# Patient Record
Sex: Male | Born: 1995
Health system: Southern US, Community
[De-identification: ages and names within clinical notes are randomized; demographics above are authoritative.]

## PROBLEM LIST (undated history)

## (undated) DIAGNOSIS — D573 Sickle-cell trait: Secondary | ICD-10-CM

---

## 2015-08-26 HISTORY — PX: KNEE SURGERY: SHX244

## 2015-12-15 ENCOUNTER — Ambulatory Visit
Admission: EM | Admit: 2015-12-15 | Discharge: 2015-12-15 | Disposition: A | Discharge status: Home / Self Care | Payer: BLUE CROSS/BLUE SHIELD | Attending: Family Medicine | Admitting: Family Medicine

## 2015-12-15 DIAGNOSIS — R319 Hematuria, unspecified: Secondary | ICD-10-CM

## 2015-12-15 DIAGNOSIS — R8299 Other abnormal findings in urine: Secondary | ICD-10-CM

## 2015-12-15 DIAGNOSIS — R82998 Other abnormal findings in urine: Secondary | ICD-10-CM

## 2015-12-15 HISTORY — DX: Sickle-cell trait: D57.3

## 2015-12-15 LAB — URINALYSIS COMPLETE WITH MICROSCOPIC (ARMC ONLY)
Bacteria, UA: NONE SEEN
Glucose, UA: NEGATIVE mg/dL
Leukocytes, UA: NEGATIVE
Nitrite: NEGATIVE
Protein, ur: 30 mg/dL — AB
Specific Gravity, Urine: >1.03 — ABNORMAL HIGH (ref 1.005–1.030)
Squamous Epithelial / LPF: NONE SEEN
WBC, UA: NONE SEEN WBC/hpf (ref 0–5)
pH: 6 (ref 5.0–8.0)

## 2015-12-15 LAB — CHLAMYDIA/NGC RT PCR (ARMC ONLY)
Chlamydia Tr: NOT DETECTED
N gonorrhoeae: NOT DETECTED

## 2015-12-15 LAB — BASIC METABOLIC PANEL
Anion gap: 9 (ref 5–15)
BUN: 11 mg/dL (ref 6–20)
CO2: 26 mmol/L (ref 22–32)
Calcium: 9.9 mg/dL (ref 8.9–10.3)
Chloride: 103 mmol/L (ref 101–111)
Creatinine, Ser: 1.16 mg/dL (ref 0.61–1.24)
GFR calc Af Amer: >60 mL/min (ref >60)
GFR calc non Af Amer: >60 mL/min (ref >60)
Glucose, Bld: 103 mg/dL — ABNORMAL HIGH (ref 65–99)
Potassium: 4.1 mmol/L (ref 3.5–5.1)
Sodium: 138 mmol/L (ref 135–145)

## 2015-12-15 NOTE — ED Notes (Signed)
Pt noticed blood in his urine once today about one hour ago. Pt denies abdominal pain, back pain, dysuria. Pt is also c/o frequency. No H/O kidney stones.

## 2015-12-15 NOTE — Discharge Instructions (Signed)

## 2015-12-15 NOTE — ED Provider Notes (Signed)
CSN: [id number]     Arrival date & time 12/15/15  1658 History   First MD Initiated Contact with Patient 12/15/15 1747     Chief Complaint  Patient presents with  . Hematuria   (Consider location/radiation/quality/duration/timing/severity/associated sxs/prior Treatment) HPI Comments: 20 yo male with a c/o blood in the urine since about one hour ago. States developed suddenly and without any pain. Denies any trauma, injury, fevers, chills, dysuria, nausea, vomiting. Has noticed some increased frequency.   The history is provided by the patient.    Past Medical History  Diagnosis Date  . Sickle cell trait [facility])    Past Surgical History  Procedure Laterality Date  . Knee surgery Left 08/2015   No family history on file. Social History  Substance Use Topics  . Smoking status: Never Smoker   . Smokeless tobacco: None  . Alcohol Use: No    Review of Systems  Allergies  Sulfa antibiotics  Home Medications   Prior to Admission medications   Not on File   Meds Ordered and Administered this Visit  Medications - No data to display  BP 116/57 mmHg  Pulse 60  Temp(Src) 98.3 F (36.8 C) (Oral)  Resp 16  Ht  (1.727 m)  Wt 177 lb (80.287 kg)  BMI 26.92 kg/m2  SpO2 100% No data found.   Physical Exam  Constitutional: He is oriented to person, place, and time. He appears well-developed and well-nourished. No distress.  HENT:  Head: Normocephalic and atraumatic.  Cardiovascular: Normal rate, regular rhythm, normal heart sounds and intact distal pulses.   No murmur heard. Pulmonary/Chest: Effort normal and breath sounds normal. No respiratory distress. He has no wheezes. He has no rales.  Abdominal: Soft. Bowel sounds are normal. He exhibits no distension and no mass. There is no tenderness. There is no rebound and no guarding. Hernia confirmed negative in the right inguinal area and confirmed negative in the left inguinal area.  Genitourinary: Testes normal and penis  normal. No penile tenderness.  Lymphadenopathy:       Right: No inguinal adenopathy present.       Left: No inguinal adenopathy present.  Neurological: He is alert and oriented to person, place, and time.  Skin: No rash noted. He is not diaphoretic.  Nursing note and vitals reviewed.   ED Course  Procedures (including critical care time)  Labs Review Labs Reviewed  URINALYSIS COMPLETEWITH MICROSCOPIC ([facility] ONLY) - Abnormal; Notable for the following:    Color, Urine AMBER (*)    APPearance CLOUDY (*)    Bilirubin Urine 1+ (*)    Ketones, ur TRACE (*)    Specific Gravity, Urine >1.030 (*)    Hgb urine dipstick 3+ (*)    Protein, ur 30 (*)    All other components within normal limits  BASIC METABOLIC PANEL - Abnormal; Notable for the following:    Glucose, Bld 103 (*)    All other components within normal limits  CHLAMYDIA/NGC RT PCR [facility] ONLY)    Imaging Review No results found.   Visual Acuity Review  Right Eye Distance:   Left Eye Distance:   Bilateral Distance:    Right Eye Near:   Left Eye Near:    Bilateral Near:         MDM   1. Hematuria   2. Crystalluria    1. Lab results, diagnosis and possible etiologies reviewed with patient and parent 2. Recommend supportive treatment with increased water intake, otc tylenol prn;  strain urine 3. Discussed with patient and mother, that patient should follow up with PCP next week for re-evaluation and recheck urine; also recommend follow up with a urologist and may need further imaging with CT scan and/or IVP due to sickle cell trait and possible associations with renal diseases  Payton Mccallum, MD 12/15/15 1944

## 2016-08-19 ENCOUNTER — Encounter (HOSPITAL_COMMUNITY): Payer: Self-pay | Admitting: Emergency Medicine

## 2016-08-19 ENCOUNTER — Emergency Department (HOSPITAL_COMMUNITY): Payer: BLUE CROSS/BLUE SHIELD

## 2016-08-19 ENCOUNTER — Emergency Department (HOSPITAL_COMMUNITY)
Admission: EM | Admit: 2016-08-19 | Discharge: 2016-08-20 | Disposition: A | Discharge status: Home / Self Care | Payer: BLUE CROSS/BLUE SHIELD | Attending: Emergency Medicine | Admitting: Emergency Medicine

## 2016-08-19 DIAGNOSIS — F1290 Cannabis use, unspecified, uncomplicated: Secondary | ICD-10-CM | POA: Diagnosis not present

## 2016-08-19 DIAGNOSIS — M25519 Pain in unspecified shoulder: Secondary | ICD-10-CM | POA: Diagnosis not present

## 2016-08-19 DIAGNOSIS — F17200 Nicotine dependence, unspecified, uncomplicated: Secondary | ICD-10-CM | POA: Insufficient documentation

## 2016-08-19 DIAGNOSIS — R079 Chest pain, unspecified: Secondary | ICD-10-CM | POA: Diagnosis present

## 2016-08-19 DIAGNOSIS — R0782 Intercostal pain: Secondary | ICD-10-CM | POA: Diagnosis not present

## 2016-08-19 NOTE — ED Triage Notes (Signed)
Pt is c/o sharp pain in his chest on the lower left side  Pt states the pain came 2 days ago went away and returned today  Pt states he has also had cracking in the center of his chest that started in June  Pt denies shortness of breath  Pt states the pain is worse with movement

## 2016-08-20 ENCOUNTER — Emergency Department (HOSPITAL_COMMUNITY): Payer: BLUE CROSS/BLUE SHIELD

## 2016-08-20 NOTE — Discharge Instructions (Signed)
Tonight your x-rays were normal.  No obvious fracture.  Your exam is consistent with a sternoclavicular laxity.  This can be further evaluated by cardiothoracic surgery You have been given a referral to Dr. Kathlee NationsPeter Van Ttight  Please call and make an appointment

## 2016-08-20 NOTE — ED Notes (Signed)
Patient verbalizes understanding of discharge instructions, home care and follow up care. Patient out of department at this time with family. 

## 2016-08-20 NOTE — ED Provider Notes (Addendum)
[facility]-EMERGENCY DEPT Provider Note   CSN: [id number] Arrival date & time: 08/19/16  1951  By signing my name below, I, Linna DarnerRussell Turner, attest that this documentation has been prepared under the direction and in the presence of Earley FavorGail Mischa Pollard, NP. Electronically Signed: Linna Darnerussell Turner, Scribe. 08/20/2016. 1:39 AM.  History   Chief Complaint Chief Complaint  Patient presents with  . Chest Pain    The history is provided by the patient. No language interpreter was used.     HPI Comments: Harry Singleton is a 20 y.o. male who presents to the Emergency Department complaining of sudden onset, constant, sharp, stabbing, mid-left chest pain for the last 2 days. Pt reports he has experienced this pain once in the past, a few months ago. Pt states this pain initially presented a couple of months ago after lifting weights and the weights struck against on his chest; he does not know what specific lift he was performing. Pt reports this pain resolved until 2 days ago. He reports he has been "cracking his chest" because his chest feels "caved in." Pt plays football and went to an Urgent Care a couple of months ago after his initial episode of chest pain and was told it was a common symptoms of football players. He denies numbness, weakness, or any other associated symptoms.  Past Medical History:  Diagnosis Date  . Sickle cell trait (HCC)     There are no active problems to display for this patient.   Past Surgical History:  Procedure Laterality Date  . KNEE SURGERY Left 08/2015       Home Medications    Prior to Admission medications   Not on File    Family History Family History  Problem Relation Age of Onset  . Stroke Other   . Heart failure Other   . Diabetes Other     Social History Social History  Substance Use Topics  . Smoking status: Current Every Day Smoker  . Smokeless tobacco: Never Used  . Alcohol use No     Allergies   Sulfa antibiotics   Review of  Systems Review of Systems  Cardiovascular: Positive for chest pain.  Neurological: Negative for weakness and numbness.  All other systems reviewed and are negative.    Physical Exam Updated Vital Signs BP 130/77 (BP Location: Left Arm)   Pulse 66   Temp 97.8 F (36.6 C) (Oral)   Resp 18   SpO2 100%   Physical Exam  Eyes: Pupils are equal, round, and reactive to light.  Cardiovascular: Normal rate.   Pulmonary/Chest: Effort normal. He exhibits tenderness.  Musculoskeletal: Normal range of motion.  Skin: Skin is warm.  Psychiatric: He has a normal mood and affect.     ED Treatments / Results  Labs (all labs ordered are listed, but only abnormal results are displayed) Labs Reviewed - No data to display  EKG  EKG Interpretation  Date/Time:  Monday August 19 2016 20:29:38 EDT Ventricular Rate:  62 PR Interval:    QRS Duration: 97 QT Interval:  376 QTC Calculation: 382 R Axis:   76 Text Interpretation:  Sinus rhythm Left ventricular hypertrophy RSR prime T wave abnormality Abnormal ekg Confirmed by Gerhard MunchLOCKWOOD, ROBERT  MD ([id number]) on 08/20/2016 1:26:35 PM       Radiology No results found.  Procedures Procedures (including critical care time)  DIAGNOSTIC STUDIES: Oxygen Saturation is 100% on RA, normal by my interpretation.    COORDINATION OF CARE: 1:50 AM Discussed treatment plan  with pt at bedside and pt agreed to plan.  Medications Ordered in ED Medications - No data to display   Initial Impression / Assessment and Plan / ED Course  I have reviewed the triage vital signs and the nursing notes.  Pertinent labs & imaging results that were available during my care of the patient were reviewed by me and considered in my medical decision making (see chart for details).  Clinical Course   Patient has a questionable injury to his chest in June.  He can't remember exactly how he injured it.  He was either lifting weights or dropped a weight on his chest, but  since that time he has been able to "pop" a rib in his anterior upper chest when he hyperextends his chest or squeezes his shoulder blades together.  This was not concerning team until today when he was playing football and he got knocked to the ground and now is having continuous sharp pain distal to that site.  No shortness of breath, cough, wheezing, numbness or take.  Chest x-ray is normal, but I will get a designated sternal view to evaluate for instability in the sternoclavicular junction    Final Clinical Impressions(s) / ED Diagnoses   Final diagnoses:  Intercostal pain  Sternoclavicular joint pain, unspecified laterality    New Prescriptions There are no discharge medications for this patient.    Earley Favor, NP 08/20/16 0401    Earley Favor, NP 08/22/16 2000    Devoria Albe, MD 08/28/16 4540    Earley Favor, NP 10/14/16 2011    Devoria Albe, MD 10/15/16 2258

## 2016-08-28 ENCOUNTER — Encounter: Payer: BLUE CROSS/BLUE SHIELD | Admitting: Cardiothoracic Surgery

## 2016-08-29 ENCOUNTER — Other Ambulatory Visit: Payer: Self-pay | Admitting: Cardiothoracic Surgery

## 2016-08-30 ENCOUNTER — Encounter: Payer: BLUE CROSS/BLUE SHIELD | Admitting: Cardiothoracic Surgery

## 2016-08-30 ENCOUNTER — Other Ambulatory Visit: Payer: Self-pay | Admitting: *Deleted

## 2016-08-30 ENCOUNTER — Encounter: Payer: Self-pay | Admitting: Cardiothoracic Surgery

## 2016-08-30 ENCOUNTER — Ambulatory Visit
Admission: RE | Admit: 2016-08-30 | Discharge: 2016-08-30 | Disposition: A | Discharge status: Home / Self Care | Payer: BLUE CROSS/BLUE SHIELD | Source: Ambulatory Visit | Attending: Cardiothoracic Surgery | Admitting: Cardiothoracic Surgery

## 2016-08-30 ENCOUNTER — Institutional Professional Consult (permissible substitution) (INDEPENDENT_AMBULATORY_CARE_PROVIDER_SITE_OTHER): Payer: BLUE CROSS/BLUE SHIELD | Admitting: Cardiothoracic Surgery

## 2016-08-30 VITALS — BP 110/72 | HR 64 | Resp 18 | Ht 68.0 in | Wt 175.0 lb

## 2016-08-30 DIAGNOSIS — S2020XD Contusion of thorax, unspecified, subsequent encounter: Secondary | ICD-10-CM | POA: Diagnosis not present

## 2016-08-30 DIAGNOSIS — R0789 Other chest pain: Secondary | ICD-10-CM

## 2016-08-30 DIAGNOSIS — S20219D Contusion of unspecified front wall of thorax, subsequent encounter: Secondary | ICD-10-CM

## 2016-08-30 DIAGNOSIS — R071 Chest pain on breathing: Principal | ICD-10-CM

## 2016-08-30 MED ORDER — IOPAMIDOL (ISOVUE-300) INJECTION 61%
75.0000 mL | Freq: Once | INTRAVENOUS | Status: AC | PRN
  Administered 2016-08-30: 75 mL via INTRAVENOUS

## 2016-08-30 NOTE — Progress Notes (Signed)
[address]       [place] [address]             [phone]        DEVERICK PRUSS [facility] Medical Record #[id number] Date of Birth: 1996/03/14  Referring: Lorre Nick, MD Primary Care: No PCP Per Patient  Chief Complaint:    Chief Complaint  Patient presents with  . Chest Pain    Referred by ED eval for supraclavicular instability    History of Present Illness:     Patient complains of left sternochondral instability and popping when he expands his chest. This first happened after he was lifting weights and the dumbbell fell on his chest This is not painful. The patient was evaluated last month and the ED with chest x-ray and sternal x-rays which were negative. The patient needs to be cleared to play college level football and presents here for thoracic surgical evaluation.  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   Zubrod Score: At the time of surgery this patient's most appropriate activity status/level should be described as: [x]     0    Normal activity, no symptoms []     1    Restricted in physical strenuous activity but ambulatory, able to do out light work []     2    Ambulatory and capable of self care, unable to do work activities, up and about                 more than 50%  Of the time                            []     3    Only limited self care, in bed greater than 50% of waking hours []     4    Completely disabled, no self care, confined to bed or chair []     5    Moribund  Past Medical History:  Diagnosis Date  . Sickle cell trait [facility])     Past Surgical History:  Procedure Laterality Date  . KNEE SURGERY Left 08/2015    History  Smoking Status  . Current Every Day Smoker  Smokeless Tobacco  . Never Used   History  Alcohol Use No    Social History   Social History  . Marital status: Single    Spouse name: N/A  . Number of children: N/A  . Years of education: N/A   Occupational History  . Not on file.   Social History Main Topics  . Smoking status: Current Every Day Smoker  . Smokeless tobacco: Never Used  . Alcohol use No  . Drug use:     Types: Marijuana  . Sexual activity: Not on file   Other Topics Concern  . Not on file   Social History Narrative  . No narrative on file    Allergies  Allergen Reactions  . Sulfa Antibiotics Other (See Comments)    Patient not sure of reaction    No current outpatient prescriptions on file.   No current facility-administered medications for this visit.      (Not in a hospital admission)  Family History  Problem Relation Age of Onset  . Stroke Other   . Heart  failure Other   . Diabetes Other      Review of Systems:       Cardiac Review of Systems: Y or N  Chest Pain [  Yes-discomfort along left sternal chondral junction with chest expansion  ]  Resting SOB [   ] Exertional SOB  [  ]  Orthopnea [  ]   Pedal Edema [   ]    Palpitations [  ] Syncope  [  ]   Presyncope [   ]  General Review of Systems: [Y] = yes [  ]=no Constitional: recent weight change [  ]; anorexia [  ]; fatigue [  ]; nausea [  ]; night sweats [  ]; fever [  ]; or chills [  ]                                                               Dental: poor dentition[  ]; Last Dentist visit: Every 6 months  Eye : blurred vision [  ]; diplopia [   ]; vision changes [  ];  Amaurosis fugax[  ]; Resp: cough [  ];  wheezing[  ];  hemoptysis[  ]; shortness of breath[  ]; paroxysmal nocturnal dyspnea[  ]; dyspnea on exertion[  ]; or orthopnea[  ];  GI:  gallstones[  ], vomiting[  ];  dysphagia[  ]; melena[  ];  hematochezia [  ]; heartburn[  ];   Hx of  Colonoscopy[  ]; GU: kidney stones [  ]; hematuria[  ];   dysuria [  ];  nocturia[  ];  history of     obstruction [   ]; urinary frequency [  ]             Skin: rash, swelling[  ];, hair loss[  ];  peripheral edema[  ];  or itching[  ]; Musculosketetal: myalgias[  ];  joint swelling[  ];  joint erythema[  ];  joint pain[  ];  back pain[  ];  Heme/Lymph: bruising[  ];  bleeding[  ];  anemia[  ];              history of G6PD deficiency-no history of hemolysis Neuro: TIA[  ];  headaches[  ];  stroke[  ];  vertigo[  ];  seizures[  ];   paresthesias[  ];  difficulty walking[  ];  Psych:depression[  ]; anxiety[  ];  Endocrine: diabetes[  ];  thyroid dysfunction[  ];  Immunizations: Flu [  ]; Pneumococcal[  ];  Other: No history of previous thoracic trauma pneumothorax  Physical Exam: BP 110/72 (BP Location: Right Arm, Patient Position: Sitting, Cuff Size: Normal)   Pulse 64   Resp 18   Ht 5\' 8"  (1.727 m)   Wt 175 lb (79.4 kg)   SpO2 98% Comment: ra  BMI 26.61 kg/m        Physical Exam  General: Well-developed 20 year old AA male no acute distress  HEENT: Normocephalic pupils equal , dentition adequate Neck: Supple without JVD, adenopathy, or bruit Chest: Clear to auscultation, symmetrical breath sounds, no rhonchi, no tenderness             or deformity. No popping or click elicited with expansion of chest Cardiovascular: Regular rate and  rhythm, no murmur, no gallop, peripheral pulses             palpable in all extremities Abdomen:  Soft, nontender, no palpable mass or organomegaly Extremities: Warm, well-perfused, no clubbing cyanosis edema or tenderness,              no venous stasis changes of the legs Rectal/GU: Deferred Neuro: Grossly non--focal and symmetrical throughout Skin: Clean and dry without rash or ulceration   Diagnostic Studies & Laboratory data:     Recent Radiology Findings:   No results found.  Previous chest x-ray and x-rays of sternum personally reviewed and counseled with patient. This shows no abnormality. I have independently reviewed the above radiologic  studies.  Recent Lab Findings: Lab Results  Component Value Date   GLUCOSE 103 (H) 12/15/2015   NA 138 12/15/2015   K 4.1 12/15/2015   CL 103 12/15/2015   CREATININE 1.16 12/15/2015   BUN 11 12/15/2015   CO2 26 12/15/2015      Assessment / Plan:     Patient will need CT scan of chest to  properly assess the junction between the sternum and the costochondral border on the left side. This will be performed today.  CT scan of the chest shows mild arthropathy of the sternal manubrial or joint with a small bone cyst of the anterior manubrium. There is no fracture. There is no surrounding hematoma.  Physical exam and CT scan findings indicate there is no structural injury to the sternum and the patient should be considered cleared for any activity.   @ME1 @ 08/30/2016 11:04 AM

## 2016-09-03 ENCOUNTER — Encounter: Payer: Self-pay | Admitting: Sports Medicine

## 2016-09-03 ENCOUNTER — Ambulatory Visit (INDEPENDENT_AMBULATORY_CARE_PROVIDER_SITE_OTHER): Payer: BLUE CROSS/BLUE SHIELD | Admitting: Sports Medicine

## 2016-09-03 VITALS — Ht 69.0 in | Wt 177.0 lb

## 2016-09-03 DIAGNOSIS — R0789 Other chest pain: Secondary | ICD-10-CM | POA: Diagnosis not present

## 2016-09-03 NOTE — Progress Notes (Signed)
   Subjective:    Patient ID: Harry Singleton, male    DOB: 03/02/1996, 20 y.o.   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 He was recently seen in the emergency room and then by Dr.Peter Donata ClayVan Trigt for possible sterno clavicular instability. Patient states that he initially felt pain in the sternal chondral area while lifting weights back in June. Since then he has had intermittent discomfort which is occasionally alleviated by popping his chest. On 08/19/2016, he was injured during football practice and was seen in the emergency room where x-rays of his chest were unremarkable. He was then referred to cardiothoracic surgery for further evaluation and a CT scan of his chest was performed. That scan showed some mild arthropathy of the sternomanubrial joint which appears to be posttraumatic. No fracture. No hematoma. No dislocation. Patient was then cleared by Dr. Donata ClayVan Trigt to return to football. Patient is currently asymptomatic. In fact, he says that his chest pain has never been really debilitating. He will take ibuprofen when needed and this does seem to help. He denies any shortness of breath.  Past history reviewed Medications reviewed Allergies reviewed    Review of Systems As above    Objective:   Physical Exam Well-developed, fit appearing. No acute distress. Awake alert and oriented 3. Vital signs reviewed  Examination of his chest shows symmetrical sternoclavicular joints with no appreciable subluxation. He is slightly tender to palpation along the midsternum but not markedly. No swelling.  CT scan of his chest is as above       Assessment & Plan:   Mild sternomanubrial arthropathy  This is a stable condition. Patient is cleared for all activities including football without restriction. He may continue with over-the-counter ibuprofen as needed for pain. Follow-up as needed.

## 2017-03-02 ENCOUNTER — Encounter (HOSPITAL_COMMUNITY): Payer: Self-pay

## 2017-03-02 ENCOUNTER — Emergency Department (HOSPITAL_COMMUNITY): Payer: BLUE CROSS/BLUE SHIELD

## 2017-03-02 DIAGNOSIS — F17200 Nicotine dependence, unspecified, uncomplicated: Secondary | ICD-10-CM | POA: Insufficient documentation

## 2017-03-02 DIAGNOSIS — R0602 Shortness of breath: Secondary | ICD-10-CM | POA: Diagnosis present

## 2017-03-02 NOTE — ED Triage Notes (Signed)
Shortness of breath yesterday after playing basketball and was unable to catch his breath, today pain with deep inspiration and moves in chest area with non productive cough voiced.  No fever voiced. Able to speak in full sentences.

## 2017-03-03 ENCOUNTER — Emergency Department (HOSPITAL_COMMUNITY)
Admission: EM | Admit: 2017-03-03 | Discharge: 2017-03-03 | Discharge status: Against Medical Advice | Payer: BLUE CROSS/BLUE SHIELD | Attending: Emergency Medicine | Admitting: Emergency Medicine

## 2017-03-03 DIAGNOSIS — R0602 Shortness of breath: Secondary | ICD-10-CM

## 2017-03-03 NOTE — ED Notes (Signed)
Patient ambulated to bathroom without difficulty. O2 sat 100% on arrival back to room.

## 2017-03-03 NOTE — ED Provider Notes (Signed)
[facility]-EMERGENCY DEPT Provider Note   CSN: [id number] Arrival date & time: 03/02/17  2123  By signing my name below, I, Harry Singleton, attest that this documentation has been prepared under the direction and in the presence of Harry Creasman, MD. Electronically Signed: Elder Singleton, Scribe. 03/03/17. 3:57 AM.   History   Chief Complaint Chief Complaint  Patient presents with  . Shortness of Breath    HPI Harry Singleton is a 21 y.o. male without any chronic medical problems who presents to the ED for evaluation of dyspnea. This patient states that yesterday he was "playing indoor basketball when he got "real out of breath". He went to sleep afterwards and woke with ongoing dyspnea. At interview currently, he states that his dyspnea has improved. He is a tobacco user. Denies chest pain or fever. No recent long distance travel. No leg swelling.   The history is provided by the patient. No language interpreter was used.  Shortness of Breath  This is a new problem. The problem occurs continuously.The current episode started 12 to 24 hours ago. The problem has been resolved. Pertinent negatives include no fever, no headaches, no coryza, no rhinorrhea, no sore throat, no swollen glands, no ear pain, no neck pain, no cough, no sputum production, no hemoptysis, no wheezing, no PND, no orthopnea, no chest pain, no syncope, no vomiting, no abdominal pain, no rash, no leg pain, no leg swelling and no claudication. Precipitated by: smoking. Risk factors include smoking. He has tried nothing for the symptoms. The treatment provided no relief. He has had no prior hospitalizations. He has had no prior ED visits. He has had no prior ICU admissions. Associated medical issues do not include asthma, PE or DVT.    Past Medical History:  Diagnosis Date  . Sickle cell trait (HCC)     There are no active problems to display for this patient.   Past Surgical History:  Procedure Laterality Date  .  KNEE SURGERY Left 08/2015       Home Medications    Prior to Admission medications   Not on File    Family History Family History  Problem Relation Age of Onset  . Stroke Other   . Heart failure Other   . Diabetes Other     Social History Social History  Substance Use Topics  . Smoking status: Current Every Day Smoker  . Smokeless tobacco: Never Used  . Alcohol use No     Allergies   Sulfa antibiotics   Review of Systems Review of Systems  Constitutional: Negative for chills and fever.  HENT: Negative for ear pain, rhinorrhea, sore throat, tinnitus, trouble swallowing and voice change.   Eyes: Negative for pain and visual disturbance.  Respiratory: Positive for shortness of breath. Negative for cough, hemoptysis, sputum production, chest tightness, wheezing and stridor.   Cardiovascular: Negative for chest pain, palpitations, orthopnea, claudication, leg swelling, syncope and PND.  Gastrointestinal: Negative for abdominal pain and vomiting.  Genitourinary: Negative for dysuria and hematuria.  Musculoskeletal: Negative for arthralgias, back pain and neck pain.  Skin: Negative for color change and rash.  Neurological: Negative for seizures, syncope and headaches.  All other systems reviewed and are negative.    Physical Exam Updated Vital Signs BP (!) 117/55   Pulse 67   Temp 97.9 F (36.6 C) (Oral)   Resp 18   Ht 6' (1.829 m)   Wt 177 lb (80.3 kg)   SpO2 100%   BMI 24.01 kg/m  Physical Exam  Constitutional: He is oriented to person, place, and time. He appears well-developed and well-nourished. No distress.  HENT:  Head: Normocephalic and atraumatic.  Mouth/Throat: Oropharynx is clear and moist. No oropharyngeal exudate.  Eyes: Conjunctivae and EOM are normal. Pupils are equal, round, and reactive to light.  Neck: Normal range of motion. Neck supple. Carotid bruit is not present.  Cardiovascular: Normal rate, regular rhythm, normal heart sounds and  intact distal pulses.   No murmur heard. Pulmonary/Chest: Effort normal and breath sounds normal. No stridor. No respiratory distress. He has no wheezes. He has no rales. He exhibits no tenderness.  Abdominal: Soft. Bowel sounds are normal. There is no tenderness. There is no rebound and no guarding.  Musculoskeletal: Normal range of motion. He exhibits no edema, tenderness or deformity.  No cords negative Homan's sign  Neurological: He is alert and oriented to person, place, and time. He displays normal reflexes.  Skin: Skin is warm and dry. Capillary refill takes less than 2 seconds.  Psychiatric: He has a normal mood and affect.  Nursing note and vitals reviewed.    ED Treatments / Results   Vitals:   03/03/17 0407 03/03/17 0408  BP: 119/75 119/75  Pulse: 60 94  Resp: 18 18  Temp:      Labs (all labs ordered are listed, but only abnormal results are displayed) Labs Reviewed - No data to display  EKG  EKG Interpretation  Date/Time:  Sunday Clarity Ciszek 08 2018 21:53:10 EDT Ventricular Rate:  65 PR Interval:    QRS Duration: 92 QT Interval:  399 QTC Calculation: 415 R Axis:   91 Text Interpretation:  Sinus arrhythmia Confirmed by [facility]  MD, Netty Sullivant ([id number]) on 03/02/2017 11:54:56 PM       Radiology Dg Chest 2 View  Result Date: 03/02/2017 CLINICAL DATA:  Left-sided chest pain and shortness of breath today. EXAM: CHEST  2 VIEW COMPARISON:  08/19/2016, 08/20/2016 and chest CT 08/30/2016 FINDINGS: Lungs are adequately inflated without airspace consolidation or effusion. Cardiomediastinal silhouette, bones and soft tissues are within normal. IMPRESSION: No active cardiopulmonary disease. Electronically Signed   By: Elberta Fortis M.D.   On: 03/02/2017 22:54    Procedures Procedures (including critical care time)     PERC negative wells 0 highly doubt PE in this very low risk patient.     Final Clinical Impressions(s) / ED Diagnoses  I suspect the patient was concerned  about a pneumothorax as he mentioned a friend with a dropped lung.  He left during the evaluation   I personally performed the services described in this documentation, which was scribed in my presence. The recorded information has been reviewed and is accurate.      Cy Blamer, MD 03/03/17 [phone]

## 2017-07-18 ENCOUNTER — Ambulatory Visit (INDEPENDENT_AMBULATORY_CARE_PROVIDER_SITE_OTHER): Payer: BLUE CROSS/BLUE SHIELD | Admitting: Sports Medicine

## 2017-07-18 VITALS — BP 116/62 | HR 65 | Ht 68.0 in | Wt 175.0 lb

## 2017-07-18 DIAGNOSIS — R0602 Shortness of breath: Secondary | ICD-10-CM | POA: Diagnosis not present

## 2017-07-18 DIAGNOSIS — R0902 Hypoxemia: Secondary | ICD-10-CM

## 2017-07-18 LAB — POCT URINALYSIS DIP (MANUAL ENTRY)
Bilirubin, UA: NEGATIVE
Blood, UA: NEGATIVE
Glucose, UA: NEGATIVE mg/dL
Ketones, POC UA: NEGATIVE mg/dL
Leukocytes, UA: NEGATIVE
Nitrite, UA: NEGATIVE
Protein Ur, POC: NEGATIVE mg/dL
Spec Grav, UA: 1.02 (ref 1.010–1.025)
Urobilinogen, UA: 0.2 E.U./dL
pH, UA: 8.5 — AB (ref 5.0–8.0)

## 2017-07-18 NOTE — Progress Notes (Signed)
Subjective:    Patient ID: Harry Singleton, male    DOB: 05-Mar-1996, 21 y.o.   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 Patient has a history of G6PD and sickle cell trait. At the start of football camp Harry Singleton got sick with a GI bug. Despite this he decided to continue working out with the football team. One day in practice he began to complain of some fatigue and shortness of breath. The athletic trainer on-site checked a pulse ox and noted that it was 76 and his middle fingers and low 80s in the other fingers. He was pulled immediately from practice and I evaluated him a few days later in the training room. At that time his pulse ox was 96. He was allowed to return to practice but was only allowed limited participation to make sure that he could acclimatize. This past week he has not had any issues with practice. Serial pulse oximetry has been done by the athletic trainers on-site. Readings were all noted to be in the high 90s. He has been asymptomatic. He had a similar issue occur back in April while playing pickup basketball. A brief workup done in the emergency room was unremarkable. He denies any significant chest pain. No shortness of breath today. No fatigue. He states that overall he feels good and has felt good for the past several days.  Past medical history is reviewed. It is most significant for the above-mentioned G6PD and sickle cell trait Surgical history reviewed Medications reviewed Allergies reviewed    Review of Systems    as above Objective:   Physical Exam  Well-developed, fit appearing. No acute distress. Awake alert and oriented 3. Vital signs reviewed Blood pressure 116/62. Pulse 65. O2 sat 99% on room air.  Cardiovascular: Regular rate. No murmurs, rubs, or gallops. Lungs: Clear to auscultation bilaterally.  No rhonchi, Rales, or wheezes. Abdomen: Benign Extremities: No edema. No calf tenderness.      Assessment & Plan:   Exercise-induced hypoxemia Sickle cell trait G6PD   I believe that Harry Singleton's symptoms are the result of a viral GI illness that triggered his G6PD. He then probably went into a mild sickle cell crisis in practice which led to his feelings of fatigue. I think the combination of his G6PD and his sickle cell trait are what triggered his hypoxemia. This hypoxemia resolved over the next 2-3 days and follow-up pulse oximetry on the sideline as well as today in the office has been in the upper 90s. The patient has been asymptomatic and participating in practice without difficulty. Therefore, I'm going to clear him to continue with practice but we will also continue serial pulse oximetry to ensure that he does not once again become hypoxemic. If his oxygen saturation level drops below 93% then the athletic trainers are instructed to remove him from practice immediately and contact me. I'm going to get some blood work (CBC with differential, peripheral smear, CMP, CK, LDH, haptoglobin, and urinalysis). I'm also going to order an echocardiogram to rule out a PFO or some other reason for a right to left shunt. I'm going to tentatively order a CTA of his chest to rule out PE. I will also order a d-dimer and if the d-dimer is negative then I will cancel his chest CT. My clinical suspicion for PE is very low but since this is  on the differential we need to exclude it.  I had a long talk with the patient about his G6PD and sickle cell trait. I explained to him the importance of reporting any symptoms to the athletic training staff immediately. He also understands that if he once again demonstrates low oxygen levels via pulse oximetry that I will need to remove him from football indefinitely. If that is the case, I will likely refer him to hematology. I also discussed the foods that he should avoid with  his G6PD. He already had a good understanding of this. The patient has given me permission to discuss these findings with his mother. I will follow-up with him in the training room with test results when available.

## 2017-07-18 NOTE — Patient Instructions (Signed)
[facility] for your Echocardiogram [address] [place]  [phone] Friday 07/25/17 at 8am Register in the [employer] at [employer]

## 2017-07-21 ENCOUNTER — Telehealth: Payer: Self-pay | Admitting: Sports Medicine

## 2017-07-21 NOTE — Telephone Encounter (Signed)
I received initial blood work on the patient this morning. D-dimer is negative so we will cancel the CT scan of his chest. He is slightly anemic at 12.6 and has normal indices. Peripheral smear is pending. CMP is completely normal. Urinalysis shows no signs of hematuria. I will await the remainder of his blood work and the patient will proceed with echocardiogram later this week. I will allow him to continue to practice and we will monitor him closely. Of note, he was asymptomatic during last week's scrimmage and was noted to have oxygen saturation levels at 97% throughout practice.

## 2017-07-22 ENCOUNTER — Other Ambulatory Visit: Payer: BLUE CROSS/BLUE SHIELD

## 2017-07-25 ENCOUNTER — Encounter (HOSPITAL_COMMUNITY): Payer: Self-pay | Admitting: Sports Medicine

## 2017-07-25 ENCOUNTER — Ambulatory Visit (HOSPITAL_COMMUNITY)
Admission: RE | Admit: 2017-07-25 | Discharge: 2017-07-25 | Disposition: A | Discharge status: Home / Self Care | Payer: BLUE CROSS/BLUE SHIELD | Source: Ambulatory Visit | Attending: Sports Medicine | Admitting: Sports Medicine

## 2017-07-25 DIAGNOSIS — I313 Pericardial effusion (noninflammatory): Secondary | ICD-10-CM | POA: Diagnosis not present

## 2017-07-25 DIAGNOSIS — R0602 Shortness of breath: Secondary | ICD-10-CM | POA: Insufficient documentation

## 2017-07-25 DIAGNOSIS — D573 Sickle-cell trait: Secondary | ICD-10-CM | POA: Insufficient documentation

## 2017-07-25 DIAGNOSIS — R0902 Hypoxemia: Secondary | ICD-10-CM | POA: Diagnosis not present

## 2017-07-25 NOTE — Progress Notes (Signed)
  Echocardiogram 2D Echocardiogram has been performed.  Leta JunglingCooper, Glennette Galster M 07/25/2017, 9:46 AM

## 2017-07-29 ENCOUNTER — Telehealth: Payer: Self-pay | Admitting: Sports Medicine

## 2017-07-29 NOTE — Telephone Encounter (Signed)
  I reviewed all of the patient's blood work. CBC is unremarkable. No signs of anemia. Peripheral smear is also normal. No signs of sickling. Haptoglobin is just below the lower limit of normal and his LDH is normal. Complete metabolic panel including renal function is normal. D-dimer is negative. Total creatinine kinase is 536 which is normal for a football player during season. Echocardiogram was also reviewed. No evidence of abnormality. No left-to-right shunt.  At this point I'm going to continue to allow the patient to continue to play football with close sideline monitoring. We will need to monitor with serial pulse oximetry especially during times of heat and dehydration. Patient also understands the importance of notifying the athletic training staff of extreme fatigue or feelings of illness while playing. I will continue to follow [facility] [employer].

## 2017-07-30 ENCOUNTER — Encounter: Payer: Self-pay | Admitting: Sports Medicine

## 2017-08-07 ENCOUNTER — Observation Stay (HOSPITAL_COMMUNITY)
Admission: EM | Admit: 2017-08-07 | Discharge: 2017-08-08 | Disposition: A | Discharge status: Home / Self Care | Payer: BLUE CROSS/BLUE SHIELD | Attending: Family Medicine | Admitting: Family Medicine

## 2017-08-07 ENCOUNTER — Emergency Department (HOSPITAL_COMMUNITY): Payer: BLUE CROSS/BLUE SHIELD

## 2017-08-07 ENCOUNTER — Encounter (HOSPITAL_COMMUNITY): Payer: Self-pay | Admitting: Emergency Medicine

## 2017-08-07 DIAGNOSIS — R109 Unspecified abdominal pain: Secondary | ICD-10-CM | POA: Diagnosis not present

## 2017-08-07 DIAGNOSIS — D72829 Elevated white blood cell count, unspecified: Secondary | ICD-10-CM | POA: Insufficient documentation

## 2017-08-07 DIAGNOSIS — K824 Cholesterolosis of gallbladder: Secondary | ICD-10-CM | POA: Insufficient documentation

## 2017-08-07 DIAGNOSIS — D550 Anemia due to glucose-6-phosphate dehydrogenase [G6PD] deficiency: Secondary | ICD-10-CM

## 2017-08-07 DIAGNOSIS — R112 Nausea with vomiting, unspecified: Secondary | ICD-10-CM | POA: Diagnosis present

## 2017-08-07 DIAGNOSIS — R1110 Vomiting, unspecified: Secondary | ICD-10-CM | POA: Diagnosis not present

## 2017-08-07 DIAGNOSIS — D573 Sickle-cell trait: Secondary | ICD-10-CM | POA: Insufficient documentation

## 2017-08-07 DIAGNOSIS — F17200 Nicotine dependence, unspecified, uncomplicated: Secondary | ICD-10-CM | POA: Insufficient documentation

## 2017-08-07 DIAGNOSIS — D75A Glucose-6-phosphate dehydrogenase (G6PD) deficiency without anemia: Secondary | ICD-10-CM | POA: Diagnosis present

## 2017-08-07 LAB — URINALYSIS, ROUTINE W REFLEX MICROSCOPIC
Bacteria, UA: NONE SEEN
Bilirubin Urine: NEGATIVE
Glucose, UA: NEGATIVE mg/dL
Hgb urine dipstick: NEGATIVE
Ketones, ur: NEGATIVE mg/dL
Leukocytes, UA: NEGATIVE
Nitrite: NEGATIVE
Protein, ur: 30 mg/dL — AB
RBC / HPF: NONE SEEN RBC/hpf (ref 0–5)
Specific Gravity, Urine: 1.018 (ref 1.005–1.030)
pH: 8 (ref 5.0–8.0)

## 2017-08-07 LAB — COMPREHENSIVE METABOLIC PANEL
ALT: 29 U/L (ref 17–63)
AST: 67 U/L — ABNORMAL HIGH (ref 15–41)
Albumin: 4.5 g/dL (ref 3.5–5.0)
Alkaline Phosphatase: 76 U/L (ref 38–126)
Anion gap: 10 (ref 5–15)
BUN: 9 mg/dL (ref 6–20)
CO2: 26 mmol/L (ref 22–32)
Calcium: 9.7 mg/dL (ref 8.9–10.3)
Chloride: 104 mmol/L (ref 101–111)
Creatinine, Ser: 1.09 mg/dL (ref 0.61–1.24)
GFR calc Af Amer: >60 mL/min (ref >60)
GFR calc non Af Amer: >60 mL/min (ref >60)
Glucose, Bld: 116 mg/dL — ABNORMAL HIGH (ref 65–99)
Potassium: 3.9 mmol/L (ref 3.5–5.1)
Sodium: 140 mmol/L (ref 135–145)
Total Bilirubin: 0.8 mg/dL (ref 0.3–1.2)
Total Protein: 7.2 g/dL (ref 6.5–8.1)

## 2017-08-07 LAB — CBC
HCT: 40.3 % (ref 39.0–52.0)
Hemoglobin: 13.3 g/dL (ref 13.0–17.0)
MCH: 27.3 pg (ref 26.0–34.0)
MCHC: 33 g/dL (ref 30.0–36.0)
MCV: 82.6 fL (ref 78.0–100.0)
Platelets: 252 10*3/uL (ref 150–400)
RBC: 4.88 MIL/uL (ref 4.22–5.81)
RDW: 13 % (ref 11.5–15.5)
WBC: 24.7 10*3/uL — ABNORMAL HIGH (ref 4.0–10.5)

## 2017-08-07 LAB — DIFFERENTIAL
Basophils Absolute: 0 10*3/uL (ref 0.0–0.1)
Basophils Relative: 0 %
Eosinophils Absolute: 0 10*3/uL (ref 0.0–0.7)
Eosinophils Relative: 0 %
Lymphocytes Relative: 5 %
Lymphs Abs: 1.2 10*3/uL (ref 0.7–4.0)
Monocytes Absolute: 0.5 10*3/uL (ref 0.1–1.0)
Monocytes Relative: 2 %
Neutro Abs: 23 10*3/uL — ABNORMAL HIGH (ref 1.7–7.7)
Neutrophils Relative %: 93 %

## 2017-08-07 LAB — LIPASE, BLOOD: Lipase: 21 U/L (ref 11–51)

## 2017-08-07 MED ORDER — ACETAMINOPHEN 650 MG RE SUPP: 650.0000 mg | Freq: Four times a day (QID) | RECTAL | Status: DC | PRN

## 2017-08-07 MED ORDER — ENOXAPARIN SODIUM 40 MG/0.4ML ~~LOC~~ SOLN
40.0000 mg | Freq: Every day | SUBCUTANEOUS | Status: DC
  Administered 2017-08-07: 40 mg via SUBCUTANEOUS
  Filled 2017-08-07: qty 0.4

## 2017-08-07 MED ORDER — PROMETHAZINE HCL 25 MG/ML IJ SOLN
12.5000 mg | Freq: Four times a day (QID) | INTRAMUSCULAR | Status: DC | PRN
  Administered 2017-08-07 (×2): 25 mg via INTRAVENOUS
  Filled 2017-08-07 (×2): qty 1

## 2017-08-07 MED ORDER — ONDANSETRON HCL 4 MG PO TABS: 4.0000 mg | ORAL_TABLET | Freq: Four times a day (QID) | ORAL | Status: DC | PRN

## 2017-08-07 MED ORDER — ONDANSETRON HCL 4 MG/2ML IJ SOLN
4.0000 mg | Freq: Once | INTRAMUSCULAR | Status: AC | PRN
  Administered 2017-08-07: 4 mg via INTRAVENOUS
  Filled 2017-08-07: qty 2

## 2017-08-07 MED ORDER — ACETAMINOPHEN 325 MG PO TABS: 650.0000 mg | ORAL_TABLET | Freq: Four times a day (QID) | ORAL | Status: DC | PRN

## 2017-08-07 MED ORDER — PROMETHAZINE HCL 25 MG/ML IJ SOLN
12.5000 mg | Freq: Once | INTRAMUSCULAR | Status: AC
  Administered 2017-08-07: 12.5 mg via INTRAVENOUS
  Filled 2017-08-07: qty 1

## 2017-08-07 MED ORDER — SODIUM CHLORIDE 0.9 % IV SOLN
INTRAVENOUS | Status: AC
  Administered 2017-08-07: 22:00:00 via INTRAVENOUS

## 2017-08-07 MED ORDER — SODIUM CHLORIDE 0.9 % IV BOLUS (SEPSIS)
1000.0000 mL | Freq: Once | INTRAVENOUS | Status: AC
  Administered 2017-08-07: 1000 mL via INTRAVENOUS

## 2017-08-07 MED ORDER — ONDANSETRON HCL 4 MG/2ML IJ SOLN: 4.0000 mg | Freq: Four times a day (QID) | INTRAMUSCULAR | Status: DC | PRN

## 2017-08-07 MED ORDER — IOPAMIDOL (ISOVUE-300) INJECTION 61%
INTRAVENOUS | Status: AC
  Filled 2017-08-07: qty 100

## 2017-08-07 MED ORDER — SENNOSIDES-DOCUSATE SODIUM 8.6-50 MG PO TABS: 1.0000 | ORAL_TABLET | Freq: Every evening | ORAL | Status: DC | PRN

## 2017-08-07 MED ORDER — FENTANYL CITRATE (PF) 100 MCG/2ML IJ SOLN: 25.0000 ug | INTRAMUSCULAR | Status: DC | PRN

## 2017-08-07 MED ORDER — MORPHINE SULFATE (PF) 4 MG/ML IV SOLN: 4.0000 mg | Freq: Once | INTRAVENOUS | Status: DC

## 2017-08-07 MED ORDER — IOPAMIDOL (ISOVUE-300) INJECTION 61%
100.0000 mL | Freq: Once | INTRAVENOUS | Status: AC | PRN
  Administered 2017-08-07: 100 mL via INTRAVENOUS

## 2017-08-07 MED ORDER — ALUM & MAG HYDROXIDE-SIMETH 200-200-20 MG/5ML PO SUSP
15.0000 mL | ORAL | Status: DC | PRN
  Filled 2017-08-07: qty 30

## 2017-08-07 MED ORDER — DIPHENHYDRAMINE HCL 50 MG/ML IJ SOLN
25.0000 mg | Freq: Once | INTRAMUSCULAR | Status: AC
  Administered 2017-08-07: 25 mg via INTRAVENOUS
  Filled 2017-08-07: qty 1

## 2017-08-07 MED ORDER — HALOPERIDOL LACTATE 5 MG/ML IJ SOLN
2.0000 mg | Freq: Once | INTRAMUSCULAR | Status: AC
  Administered 2017-08-07: 2 mg via INTRAVENOUS
  Filled 2017-08-07: qty 1

## 2017-08-07 MED ORDER — METOCLOPRAMIDE HCL 5 MG/ML IJ SOLN
10.0000 mg | Freq: Once | INTRAMUSCULAR | Status: AC
  Administered 2017-08-07: 10 mg via INTRAVENOUS
  Filled 2017-08-07: qty 2

## 2017-08-07 NOTE — ED Provider Notes (Addendum)
Pt presents with abdominal pain and vomiting.  Seen at an urgent care and send to the ED.  In the ED sx have persisted despite multiple meds.  CT scan with questionable GB thickening.  US shows same, without gallstones or pericholecystic fluid. Doubt acute cholecystitis.   With his persistent vomiting, will admit to the hospital for further treatment.  Medical screening examination/treatment/procedure(s) were conducted as a shared visit with non-physician practitioner(s) and myself.  I personally evaluated the patient during the encounter.   EKG Interpretation  Date/Time:  Thursday August 07 2017 17:07:26 EDT Ventricular Rate:  62 PR Interval:    QRS Duration: 92 QT Interval:  365 QTC Calculation: 371 R Axis:   92 Text Interpretation:  Sinus arrhythmia Borderline right axis deviation no change from previous Reconfirmed by Linwood DibblesKnapp, Lakendra Helling [phone]([id number]) on 08/07/2017 7:25:05 PM          Linwood DibblesKnapp, Caroleena Paolini, MD 08/07/17 1925

## 2017-08-07 NOTE — ED Triage Notes (Addendum)
Per EMS. Pt from [facility]. Pt reports he has had n/v and generalized abd pain since yesterday. Pt went to football practice which made his symptoms worse. UC gave pt 25mg  phenergan and 60mg  tordol prior to arrival. Pt reports nausea medication has not helped

## 2017-08-07 NOTE — ED Provider Notes (Signed)
[facility]-EMERGENCY DEPT Provider Note   CSN: [id number] Arrival date & time: 08/07/17  1337     History   Chief Complaint Chief Complaint  Patient presents with  . Emesis    HPI  Harry Singleton is a 21 y.o. Male with sickle cell trait and G6PD Deficiency, no history of abdominal surgeries, who presents from [facility] urgent care with nausea, vomiting and abdominal pain. Patient reports abdominal pain is located in the periumbilical region, pain has not migrated. Pain and vomiting started this morning at 10 AM, patient reports the abdominal pain comes and goes and is sharp in nature. Patient reports pain is made worse when he is moving around, has not tried any meds at home to make it better. Patient received Phenergan and Toradol at urgent care and reports these helped some. Patient reports 2 episodes of diarrhea today. Patient denies bloody emesis or bowel movements. Patient is complaining of some burning midsternal chest pain, he reports he's had this before it comes and goes, and is worse when he eats, he feels like this pain is worse with all the vomiting. Patient denies urinary symptoms. Patient reports some chills denies fever. Patient denies alcohol use, reports he hasn't smoked cigarettes or cannabis in over a year, and denies any other substance use.       Past Medical History:  Diagnosis Date  . Sickle cell trait (HCC)     There are no active problems to display for this patient.   Past Surgical History:  Procedure Laterality Date  . KNEE SURGERY Left 08/2015       Home Medications    Prior to Admission medications   Not on File    Family History Family History  Problem Relation Age of Onset  . Stroke Other   . Heart failure Other   . Diabetes Other     Social History Social History  Substance Use Topics  . Smoking status: Current Every Day Smoker  . Smokeless tobacco: Never Used  . Alcohol use No     Allergies   Sulfa antibiotics   Review of  Systems Review of Systems  Constitutional: Negative for chills and fatigue.  HENT: Negative for congestion, ear pain, rhinorrhea and sore throat.   Eyes: Negative for photophobia and visual disturbance.  Respiratory: Negative for choking and chest tightness.   Cardiovascular: Positive for chest pain. Negative for palpitations.  Gastrointestinal: Positive for abdominal pain, diarrhea, nausea and vomiting.  Genitourinary: Negative for difficulty urinating, dysuria and flank pain.     Physical Exam Updated Vital Signs BP 120/75 (BP Location: Right Arm)   Pulse 78   Temp 98.2 F (36.8 C) (Oral)   Resp (!) 21   Ht  (1.753 m)   Wt 79.4 kg (175 lb)   SpO2 100%   BMI 25.84 kg/m   Physical Exam  Constitutional: He appears well-developed and well-nourished. No distress.  Patient appears sleepy on exam, received dose of Phenergan just before arrival  HENT:  Head: Normocephalic and atraumatic.  Eyes: Pupils are equal, round, and reactive to light. EOM are normal. Right eye exhibits no discharge. Left eye exhibits no discharge.  Cardiovascular: Normal rate, regular rhythm, normal heart sounds and intact distal pulses.   Pulmonary/Chest: Effort normal and breath sounds normal. No respiratory distress. He has no wheezes. He has no rales.  Abdominal: Soft. Bowel sounds are normal. He exhibits no distension and no mass. There is tenderness.  Patient tender to palpation in the periumbilical  area with some voluntary guarding, no rebound tenderness, negative Murphy sign, no tenderness at McBurney's point, no CVA tenderness  Musculoskeletal: He exhibits no edema or deformity.  Neurological: He is alert. Coordination normal.  Speech is clear, able to follow commands CN III-XII intact Normal strength in upper and lower extremities bilaterally including dorsiflexion and plantar flexion, strong and equal grip strength Sensation normal to light and sharp touch Moves extremities without ataxia,  coordination intact  Skin: Skin is warm and dry. Capillary refill takes less than 2 seconds. He is not diaphoretic.  Psychiatric: He has a normal mood and affect. His behavior is normal.  Nursing note and vitals reviewed.    ED Treatments / Results  Labs (all labs ordered are listed, but only abnormal results are displayed) Labs Reviewed  COMPREHENSIVE METABOLIC PANEL - Abnormal; Notable for the following:       Result Value   Glucose, Bld 116 (*)    AST 67 (*)    All other components within normal limits  CBC - Abnormal; Notable for the following:    WBC 24.7 (*)    All other components within normal limits  URINALYSIS, ROUTINE W REFLEX MICROSCOPIC - Abnormal; Notable for the following:    Protein, ur 30 (*)    Squamous Epithelial / LPF 0-5 (*)    All other components within normal limits  DIFFERENTIAL - Abnormal; Notable for the following:    Neutro Abs 23.0 (*)    All other components within normal limits  LIPASE, BLOOD  HIV ANTIBODY (ROUTINE TESTING)  COMPREHENSIVE METABOLIC PANEL  CBC    EKG  EKG Interpretation  Date/Time:  Thursday August 07 2017 17:07:26 EDT Ventricular Rate:  62 PR Interval:    QRS Duration: 92 QT Interval:  365 QTC Calculation: 371 R Axis:   92 Text Interpretation:  Unknown rhythm, irregular rate Borderline right axis deviation no change from previous Confirmed by Arby Barrette [phone]) on 08/07/2017 5:19:01 PM       Radiology Ct Abdomen Pelvis W Contrast  Result Date: 08/07/2017 CLINICAL DATA:  Per EMS. Pt from [facility]. Pt reports he has had n/v and generalized abd pain since yesterday. Pt went to football practice which made his symptoms worse. UC gave pt  phenergan and  tordol prior to arrival. Pt reports nausea medication has not helped.Pt actively vomiting/chills in CT EXAM: CT ABDOMEN AND PELVIS WITH CONTRAST TECHNIQUE: Multidetector CT imaging of the abdomen and pelvis was performed using the standard protocol following  bolus administration of intravenous contrast. CONTRAST:  ISOVUE-300 IOPAMIDOL (ISOVUE-300) INJECTION 61% COMPARISON:  None. FINDINGS: Lower chest: Clear lung bases.  Heart is normal in size. Hepatobiliary: Normal liver. There is thickening of the wall of the gallbladder to approximately 5 mm. No CT evidence of a gallstone no bile duct dilation. No adjacent inflammation. Pancreas: Unremarkable. No pancreatic ductal dilatation or surrounding inflammatory changes. Spleen: Normal in size without focal abnormality. Adrenals/Urinary Tract: No adrenal masses. Tiny density in the midpole the right kidney that is likely a nonobstructing stone. Several densities in the left renal calices may reflect contrast, small stones or a combination. No renal masses. No hydronephrosis. There symmetric renal enhancement. Ureters are normal in course and caliber. Bladder is unremarkable. Stomach/Bowel: Bowel evaluation somewhat limited by lack of contrast and peritoneal fat. Allowing for this, there is no bowel dilation. There is no wall thickening or adjacent inflammation. Stomach is unremarkable. Appendix not definitively seen, but there is no evidence of appendicitis. Vascular/Lymphatic:  No significant vascular findings are present. No enlarged abdominal or pelvic lymph nodes. Reproductive: Prostate is unremarkable. Other: No abdominal wall hernia or abnormality. No abdominopelvic ascites. Musculoskeletal: No acute or significant osseous findings. IMPRESSION: 1. Gallbladder wall is thickened to 5 mm. The etiology of this is unclear. Recommend follow-up limited right upper quadrant ultrasound for further assessment. 2. No other abnormalities. No other findings to account for the patient's symptoms. Electronically Signed   By: Amie Portland M.D.   On: 08/07/2017 17:47   US Abdomen Limited Ruq  Result Date: 08/07/2017 CLINICAL DATA:  Initial evaluation for right upper quadrant pain with vomiting. EXAM: ULTRASOUND ABDOMEN LIMITED  RIGHT UPPER QUADRANT COMPARISON:  Prior CT from earlier the same day. FINDINGS: Gallbladder: No echogenic stones seen within the gallbladder lumen. Few small polyps noted, largest of which measures 4.2 mm. These are felt to be incidental in nature and of doubtful significance. Gallbladder wall mildly thickened up to 4.4 mm. No free pericholecystic fluid. Evaluation for sonographic Eulah Pont sign limited as the patient was medicated. Common bile duct: Diameter: 5.6 mm Liver: No focal lesion identified. Within normal limits in parenchymal echogenicity. Portal vein is patent on color Doppler imaging with normal direction of blood flow towards the liver. IMPRESSION: 1. Mild gallbladder wall thickening without additional sonographic features for acute cholecystitis. No cholelithiasis identified. 2. No biliary dilatation. 3. Few subcentimeter gallbladder polyps measuring up to 4 mm. These are felt to be incidental in nature, and of no clinical significance. No follow-up imaging recommended regarding these findings. Electronically Signed   By: Rise Mu M.D.   On: 08/07/2017 19:03    Procedures Procedures (including critical care time)  Medications Ordered in ED Medications  morphine 4 MG/ML injection 4 mg (0 mg Intravenous Hold 08/07/17 1608)  iopamidol (ISOVUE-300) 61 % injection (not administered)  enoxaparin (LOVENOX) injection 40 mg (not administered)  0.9 %  sodium chloride infusion (not administered)  acetaminophen (TYLENOL) tablet 650 mg (not administered)    Or  acetaminophen (TYLENOL) suppository 650 mg (not administered)  senna-docusate (Senokot-S) tablet 1 tablet (not administered)  ondansetron (ZOFRAN) tablet 4 mg (not administered)    Or  ondansetron (ZOFRAN) injection 4 mg (not administered)  fentaNYL (SUBLIMAZE) injection 25-50 mcg (not administered)  promethazine (PHENERGAN) injection 12.5-25 mg (not administered)  ondansetron (ZOFRAN) injection 4 mg (4 mg Intravenous Given  08/07/17 1438)  sodium chloride 0.9 % bolus 1,000 mL (0 mLs Intravenous Stopped 08/07/17 1712)  metoCLOPramide (REGLAN) injection 10 mg (10 mg Intravenous Given 08/07/17 1712)  diphenhydrAMINE (BENADRYL) injection 25 mg (25 mg Intravenous Given 08/07/17 1712)  iopamidol (ISOVUE-300) 61 % injection 100 mL (100 mLs Intravenous Contrast Given 08/07/17 1721)  haloperidol lactate (HALDOL) injection 2 mg (2 mg Intravenous Given 08/07/17 1821)  promethazine (PHENERGAN) injection 12.5 mg (12.5 mg Intravenous Given 08/07/17 1822)  sodium chloride 0.9 % bolus 1,000 mL (1,000 mLs Intravenous New Bag/Given 08/07/17 1822)     Initial Impression / Assessment and Plan / ED Course  I have reviewed the triage vital signs and the nursing notes.  Pertinent labs & imaging results that were available during my care of the patient were reviewed by me and considered in my medical decision making (see chart for details).  Patient presents with N/V and periumbilical abdominal pain from Urgent care. Vitals are normal and patient is afebrile on initial eval. Patient received Phenergan PTA and is drowsy during encounter and continues to vomit, will give Zofran and fluid bolus. Will check CBC,  CMP, lipase and UA. Patient is pretty tender in the periumbilical region, no migration, possible appendicitis, will get abdominal CT.  On re-eval patient is still vomiting and complaining of burning in his chest mid-sternally, burning pain likely due to esophageal irritation. Will get EKG also to check QTc given the use of multiple anti-emetics, not concerned for ACS. Labs show leukocytosis to 24.7 and slight elevation of AST, labs otherwise unremarkable.  On repeat abdominal exam patient continues to have periumbilical tenderness. CT scan shows gallbladder wall thickening, no evidence of appendicitis or other acute intraabdominal pathology, will further investigate with RUQ ultrasound. Patient continues to have nausea and vomiting despite  multiple medications, will try phenergan and haldol and provide another fluid bolus.  RUQ Ultrasound shows mild wall thickening of gallbladder, no other signs of acute cholecystitis, no cholelithiasis, few small gallbladder polyps of no clinical significance. Despite no significant findings on imaging patient continues to have intractable vomiting despite multiple agents. Patient will need to be admitted for continued support with fluids and antiemetics. Intractable vomiting most likely viral, cannabis induced cyclic vomiting considered but patient denies cannabis use in the last year.  Patient discussed with Dr. Linwood DibblesJon Knapp, who saw patient as well and agrees with plan. Hospitalist service consulted for admission, Dr. Antionette Charpyd will see and admit the patient.  Final Clinical Impressions(s) / ED Diagnoses   Final diagnoses:  Abdominal pain  Intractable vomiting with nausea, unspecified vomiting type    New Prescriptions New Prescriptions   No medications on file     Dartha LodgeFord, Katharina Jehle N, [place] JerseyPA-C 08/08/17 [id number]

## 2017-08-07 NOTE — ED Notes (Signed)
Bed: [id number] Expected date:  Expected time:  Means of arrival:  Comments: EMS 21 y/o n/v, IV established

## 2017-08-07 NOTE — H&P (Signed)
History and Physical    Harry Singleton ZOX:[id number] DOB: 1996/04/10 DOA: 08/07/2017  PCP: Patient, No Pcp Per   Patient coming from: Home  Chief Complaint: Abdominal pain, N/V  HPI: Harry Singleton is a 21 y.o. male with medical history significant for sickle cell trait and G6PD deficiency, now presenting to the emergency department for evaluation of abdominal pain with nausea and vomiting. The patient reports that he had been in his usual state of health into this morning when he developed pain in the mid abdomen with nausea and non bloody vomiting. Pain is described as severe, waxing and waning, sharp, localized to the periumbilical region, and without any alleviating or exacerbating factors identified. Patient denies any hematemesis and denies diarrhea, melena, or hematochezia. He did have two loose stools today. There is no fevers, chills, recent travel, or sick contacts. Patient denies any use of alcohol or illicit substances. He has had similar symptoms approximately one year ago which was attributed to a "GI bug," and resolved spontaneously over the course of a few days. Patient went to urgent care for evaluation of these complaints and was treated with Phenergan and Toradol before being directed to the emergency department.  ED Course: Upon arrival to the ED, patient is found to be afebrile, saturating well on room air, and with vitals otherwise stable. EKG features a sinus rhythm. Chemistry panels notable for an AST of 67 and CBC features a leukocytosis to 24,700. Lipase level was normal and urinalysis is unremarkable. Patient was treated with 2 L of normal saline in the ED and given Benadryl, Reglan, Zofran, Phenergan, and Haldol. Pain was treated with IV morphine. Despite this aggressive treatment, the patient continues to experience nausea with vomiting and will be observed on the medical surgical unit for ongoing evaluation and management of this.  Review of Systems:  All other  systems reviewed and apart from HPI, are negative.  Past Medical History:  Diagnosis Date  . Sickle cell trait [facility])     Past Surgical History:  Procedure Laterality Date  . KNEE SURGERY Left 08/2015     reports that he has been smoking.  He has never used smokeless tobacco. He reports that he uses drugs, including Marijuana. He reports that he does not drink alcohol.  Allergies  Allergen Reactions  . Blueberry Flavor     unknown  . Sulfa Antibiotics Other (See Comments)    Patient not sure of reaction    Family History  Problem Relation Age of Onset  . Stroke Other   . Heart failure Other   . Diabetes Other      Prior to Admission medications   Medication Sig Start Date End Date Taking? Authorizing Provider  acetaminophen (TYLENOL) 500 MG tablet Take 1,000 mg by mouth every 6 (six) hours as needed for mild pain, moderate pain, fever or headache.   Yes [provider]    Physical Exam: Vitals:   08/07/17 1353 08/07/17 1709  BP: 120/75 112/65  Pulse: 78 66  Resp: (!) 21 17  Temp: 98.2 F (36.8 C)   TempSrc: Oral   SpO2: 100% 100%  Weight: 79.4 kg (175 lb)   Height:  (1.753 m)       Constitutional: NAD, calm, in apparent discomfort Eyes: PERTLA, lids and conjunctivae normal ENMT: Mucous membranes are moist. Posterior pharynx clear of any exudate or lesions.   Neck: normal, supple, no masses, no thyromegaly Respiratory: clear to auscultation bilaterally, no wheezing, no crackles. Normal  respiratory effort.   Cardiovascular: S1 & S2 heard, regular rate and rhythm. No extremity edema. 2+ pedal pulses. No significant JVD. Abdomen: No distension, soft, tender about the umbilicus without rebound pain or guarding. No RUQ tenderness. Bowel sounds active.  Musculoskeletal: no clubbing / cyanosis. No joint deformity upper and lower extremities.    Skin: no significant rashes, lesions, ulcers. Warm, dry, well-perfused. Neurologic: CN 2-12 grossly intact.  Sensation intact. Strength 5/5 in all 4 limbs.  Psychiatric: Alert and oriented x 3. Calm, cooperative.     Labs on Admission: I have personally reviewed following labs and imaging studies  CBC:  Recent Labs Lab 08/07/17 1541  WBC 24.7*  NEUTROABS 23.0*  HGB 13.3  HCT 40.3  MCV 82.6  PLT 252   Basic Metabolic Panel:  Recent Labs Lab 08/07/17 1541  NA 140  K 3.9  CL 104  CO2 26  GLUCOSE 116*  BUN 9  CREATININE 1.09  CALCIUM 9.7   GFR: Estimated Creatinine Clearance: 107.2 mL/min (by C-G formula based on SCr of 1.09 mg/dL). Liver Function Tests:  Recent Labs Lab 08/07/17 1541  AST 67*  ALT 29  ALKPHOS 76  BILITOT 0.8  PROT 7.2  ALBUMIN 4.5    Recent Labs Lab 08/07/17 1541  LIPASE 21   No results for input(s): AMMONIA in the last 168 hours. Coagulation Profile: No results for input(s): INR, PROTIME in the last 168 hours. Cardiac Enzymes: No results for input(s): CKTOTAL, CKMB, CKMBINDEX, TROPONINI in the last 168 hours. BNP (last 3 results) No results for input(s): PROBNP in the last 8760 hours. HbA1C: No results for input(s): HGBA1C in the last 72 hours. CBG: No results for input(s): GLUCAP in the last 168 hours. Lipid Profile: No results for input(s): CHOL, HDL, LDLCALC, TRIG, CHOLHDL, LDLDIRECT in the last 72 hours. Thyroid Function Tests: No results for input(s): TSH, T4TOTAL, FREET4, T3FREE, THYROIDAB in the last 72 hours. Anemia Panel: No results for input(s): VITAMINB12, FOLATE, FERRITIN, TIBC, IRON, RETICCTPCT in the last 72 hours. Urine analysis:    Component Value Date/Time   COLORURINE YELLOW 08/07/2017 1710   APPEARANCEUR CLEAR 08/07/2017 1710   LABSPEC 1.018 08/07/2017 1710   PHURINE 8.0 08/07/2017 1710   GLUCOSEU NEGATIVE 08/07/2017 1710   HGBUR NEGATIVE 08/07/2017 1710   BILIRUBINUR NEGATIVE 08/07/2017 1710   BILIRUBINUR negative 07/18/2017 1118   KETONESUR NEGATIVE 08/07/2017 1710   PROTEINUR 30 (A) 08/07/2017 1710    UROBILINOGEN 0.2 07/18/2017 1118   NITRITE NEGATIVE 08/07/2017 1710   LEUKOCYTESUR NEGATIVE 08/07/2017 1710   Sepsis Labs: (procalcitonin:4,lacticidven:4) )No results found for this or any previous visit (from the past 240 hour(s)).   Radiological Exams on Admission: Ct Abdomen Pelvis W Contrast  Result Date: 08/07/2017 CLINICAL DATA:  Per EMS. Pt from [facility]. Pt reports he has had n/v and generalized abd pain since yesterday. Pt went to football practice which made his symptoms worse. UC gave pt  phenergan and  tordol prior to arrival. Pt reports nausea medication has not helped.Pt actively vomiting/chills in CT EXAM: CT ABDOMEN AND PELVIS WITH CONTRAST TECHNIQUE: Multidetector CT imaging of the abdomen and pelvis was performed using the standard protocol following bolus administration of intravenous contrast. CONTRAST:  ISOVUE-300 IOPAMIDOL (ISOVUE-300) INJECTION 61% COMPARISON:  None. FINDINGS: Lower chest: Clear lung bases.  Heart is normal in size. Hepatobiliary: Normal liver. There is thickening of the wall of the gallbladder to approximately 5 mm. No CT evidence of a gallstone no bile duct dilation.  No adjacent inflammation. Pancreas: Unremarkable. No pancreatic ductal dilatation or surrounding inflammatory changes. Spleen: Normal in size without focal abnormality. Adrenals/Urinary Tract: No adrenal masses. Tiny density in the midpole the right kidney that is likely a nonobstructing stone. Several densities in the left renal calices may reflect contrast, small stones or a combination. No renal masses. No hydronephrosis. There symmetric renal enhancement. Ureters are normal in course and caliber. Bladder is unremarkable. Stomach/Bowel: Bowel evaluation somewhat limited by lack of contrast and peritoneal fat. Allowing for this, there is no bowel dilation. There is no wall thickening or adjacent inflammation. Stomach is unremarkable. Appendix not definitively seen, but there  is no evidence of appendicitis. Vascular/Lymphatic: No significant vascular findings are present. No enlarged abdominal or pelvic lymph nodes. Reproductive: Prostate is unremarkable. Other: No abdominal wall hernia or abnormality. No abdominopelvic ascites. Musculoskeletal: No acute or significant osseous findings. IMPRESSION: 1. Gallbladder wall is thickened to 5 mm. The etiology of this is unclear. Recommend follow-up limited right upper quadrant ultrasound for further assessment. 2. No other abnormalities. No other findings to account for the patient's symptoms. Electronically Signed   By: Amie Portland M.D.   On: 08/07/2017 17:47   US Abdomen Limited Ruq  Result Date: 08/07/2017 CLINICAL DATA:  Initial evaluation for right upper quadrant pain with vomiting. EXAM: ULTRASOUND ABDOMEN LIMITED RIGHT UPPER QUADRANT COMPARISON:  Prior CT from earlier the same day. FINDINGS: Gallbladder: No echogenic stones seen within the gallbladder lumen. Few small polyps noted, largest of which measures 4.2 mm. These are felt to be incidental in nature and of doubtful significance. Gallbladder wall mildly thickened up to 4.4 mm. No free pericholecystic fluid. Evaluation for sonographic Eulah Pont sign limited as the patient was medicated. Common bile duct: Diameter: 5.6 mm Liver: No focal lesion identified. Within normal limits in parenchymal echogenicity. Portal vein is patent on color Doppler imaging with normal direction of blood flow towards the liver. IMPRESSION: 1. Mild gallbladder wall thickening without additional sonographic features for acute cholecystitis. No cholelithiasis identified. 2. No biliary dilatation. 3. Few subcentimeter gallbladder polyps measuring up to 4 mm. These are felt to be incidental in nature, and of no clinical significance. No follow-up imaging recommended regarding these findings. Electronically Signed   By: Rise Mu M.D.   On: 08/07/2017 19:03    EKG: Independently reviewed. Sinus  rhythm.   Assessment/Plan  1. Abdominal pain with intractable nausea and vomiting  - Pt presents with pain in the central abdomen, waxing and waning, and nausea with non-bloody vomiting  - CT abd/pelvis and RUQ [place] reviewed, and no findings to explain sxs  - Leukocytosis noted, but labs overall reassurring  - Pt was provided with 2 L NS and aggressive symptomatic treatment in ED with Zofran, phenergan, Reglan, Benadryl, morphine, and Haldol, but continues to experience nausea and is still vomiting  - Suspect this reflects a viral illness  - Plan to continue supportive care IVF hydration, prn analgesia, prn antiemetics  2. Leukocytosis  - WBC is 24,700 on admission without fever  - Likely reflects acute viral illness or stress of intractable vomiting  - Culture if febrile, repeat CBC in am   3. G6PD deficiency  - No evidence for hemolysis - Avoid oxidative medications  - Repeat CBC in am    DVT prophylaxis: sq Lovenox Code Status: Full  Family Communication: Discussed with patient Disposition Plan: Observe on med-surg Consults called: None Admission status: Observation    Briscoe Deutscher, MD [facility] Pager [phone]  If 7PM-7AM, please contact night-coverage [email or web address] Password [email or web address]  08/07/2017, 7:42 PM

## 2017-08-07 NOTE — ED Notes (Signed)
Unable to draw labs with iv insertion.

## 2017-08-08 DIAGNOSIS — R112 Nausea with vomiting, unspecified: Secondary | ICD-10-CM | POA: Diagnosis not present

## 2017-08-08 DIAGNOSIS — R109 Unspecified abdominal pain: Secondary | ICD-10-CM | POA: Diagnosis not present

## 2017-08-08 DIAGNOSIS — R1110 Vomiting, unspecified: Secondary | ICD-10-CM | POA: Diagnosis not present

## 2017-08-08 DIAGNOSIS — D72829 Elevated white blood cell count, unspecified: Secondary | ICD-10-CM | POA: Diagnosis not present

## 2017-08-08 LAB — COMPREHENSIVE METABOLIC PANEL
ALT: 26 U/L (ref 17–63)
AST: 61 U/L — ABNORMAL HIGH (ref 15–41)
Albumin: 4 g/dL (ref 3.5–5.0)
Alkaline Phosphatase: 71 U/L (ref 38–126)
Anion gap: 9 (ref 5–15)
BUN: 11 mg/dL (ref 6–20)
CO2: 26 mmol/L (ref 22–32)
Calcium: 9.4 mg/dL (ref 8.9–10.3)
Chloride: 107 mmol/L (ref 101–111)
Creatinine, Ser: 1.03 mg/dL (ref 0.61–1.24)
GFR calc Af Amer: >60 mL/min (ref >60)
GFR calc non Af Amer: >60 mL/min (ref >60)
Glucose, Bld: 82 mg/dL (ref 65–99)
Potassium: 3.8 mmol/L (ref 3.5–5.1)
Sodium: 142 mmol/L (ref 135–145)
Total Bilirubin: 0.6 mg/dL (ref 0.3–1.2)
Total Protein: 6.5 g/dL (ref 6.5–8.1)

## 2017-08-08 LAB — CBC
HCT: 36.3 % — ABNORMAL LOW (ref 39.0–52.0)
Hemoglobin: 12.1 g/dL — ABNORMAL LOW (ref 13.0–17.0)
MCH: 27.7 pg (ref 26.0–34.0)
MCHC: 33.3 g/dL (ref 30.0–36.0)
MCV: 83.1 fL (ref 78.0–100.0)
Platelets: 285 10*3/uL (ref 150–400)
RBC: 4.37 MIL/uL (ref 4.22–5.81)
RDW: 13.5 % (ref 11.5–15.5)
WBC: 23.3 10*3/uL — ABNORMAL HIGH (ref 4.0–10.5)

## 2017-08-08 NOTE — Discharge Summary (Signed)
Physician Discharge Summary  Harry Singleton QIO:[id number] DOB: [birth year] DOA: 08/07/2017  PCP: De Blanch., MD  Admit date: 08/07/2017 Discharge date: 08/08/2017  Recommendations for Outpatient Follow-up:  1. Resolution of leukocytosis  Follow-up Information    De Blanch., MD. Schedule an appointment as soon as possible for a visit in 1 week(s).   Specialty:  Pediatrics Contact information: [address] [place] [address] [phone]            Discharge Diagnoses:  1. Abdominal pain with intractable nausea and vomiting 2. Leukocytosis 3. Sickle cell trait 4. G6PD deficiency  Discharge Condition: Improved Disposition: Home  Diet recommendation: Regular  Filed Weights   08/07/17 1353 08/07/17 2229  Weight: 79.4 kg (175 lb) 78 kg (171 lb 14.4 oz)    History of present illness:  21 year old man PMH sickle cell trait, G6PD deficiency, presented with  nausea, vomiting, periumbilical pain. Admitted for the same. CT abdomen and pelvis and right upper quadrant ultrasound without findings to explain symptoms. Treated with fluids, antiemetics, Reglan, analgesics. Viral illness was suspected.  Hospital Course:  Patient was observed overnight, had complete resolution of abdominal pain, nausea and vomiting. CT abdomen and pelvis showed a thickened gallbladder wall of unclear significance. No other abnormalities noted. Right upper quadrant ultrasound confirmed mild gallbladder wall thickening but there were no features to suggest acute cholecystitis. No cholelithiasis. No biliary dilatation. Urinalysis was unremarkable.  Diet was advanced and the patient tolerated this without difficulty. He requested discharge home. Leukocytosis was noted, of unclear significance but patient without pain and there is no evidence of infection. Suspect stress reaction. Expect spontaneous resolution. I did discuss this in detail with the patient and his family at  the bedside. He knows to return for recurrent pain, fever or worsening of condition. I did recommend he contact his primary care physician and have blood work checked in the next week.  Today's assessment: S: Feels very well. No nausea or vomiting. No abdominal pain. O: Vitals: 98.5, 16, 73, 120/59   Constitutional. Appears calm, comfortable.  Respiratory. Clear to auscultation bilaterally. No wheezes, rales or rhonchi. Normal respiratory effort.  Cardiovascular. Regular rate and rhythm. No murmur, rub or gallop.  Psychiatric. Grossly normal mood and affect. Speech fluent and appropriate.  Abdomen. Soft, nontender, nondistended.  Labs:  Complete metabolic panel unremarkable except for AST of 61, unclear significance  WBC without significant change, 23.3  Imaging studies:  CT abdomen and pelvis and right upper quadrant ultrasound noted  Discharge Instructions  Discharge Instructions    Activity as tolerated - No restrictions    Complete by:  As directed    Discharge instructions    Complete by:  As directed    Call your physician or seek immediate medical attention for vomiting, abdominal pain, fever or worsening of condition.     Allergies as of 08/08/2017      Reactions   Blueberry Flavor    unknown   Sulfa Antibiotics Other (See Comments)   Patient not sure of reaction      Medication List    TAKE these medications   acetaminophen 500 MG tablet Commonly known as:  TYLENOL Take 1,000 mg by mouth every 6 (six) hours as needed for mild pain, moderate pain, fever or headache.            Discharge Care Instructions        Start     Ordered   08/08/17 0000  Activity as  tolerated - No restrictions     08/08/17 1619   08/08/17 0000  Discharge instructions    Comments:  Call your physician or seek immediate medical attention for vomiting, abdominal pain, fever or worsening of condition.   08/08/17 1619     Allergies  Allergen Reactions  . Blueberry  Flavor     unknown  . Sulfa Antibiotics Other (See Comments)    Patient not sure of reaction    The results of significant diagnostics from this hospitalization (including imaging, microbiology, ancillary and laboratory) are listed below for reference.    Significant Diagnostic Studies: Ct Abdomen Pelvis W Contrast  Result Date: 08/07/2017 CLINICAL DATA:  Per EMS. Pt from [facility]. Pt reports he has had n/v and generalized abd pain since yesterday. Pt went to football practice which made his symptoms worse. UC gave pt  phenergan and  tordol prior to arrival. Pt reports nausea medication has not helped.Pt actively vomiting/chills in CT EXAM: CT ABDOMEN AND PELVIS WITH CONTRAST TECHNIQUE: Multidetector CT imaging of the abdomen and pelvis was performed using the standard protocol following bolus administration of intravenous contrast. CONTRAST:  ISOVUE-300 IOPAMIDOL (ISOVUE-300) INJECTION 61% COMPARISON:  None. FINDINGS: Lower chest: Clear lung bases.  Heart is normal in size. Hepatobiliary: Normal liver. There is thickening of the wall of the gallbladder to approximately 5 mm. No CT evidence of a gallstone no bile duct dilation. No adjacent inflammation. Pancreas: Unremarkable. No pancreatic ductal dilatation or surrounding inflammatory changes. Spleen: Normal in size without focal abnormality. Adrenals/Urinary Tract: No adrenal masses. Tiny density in the midpole the right kidney that is likely a nonobstructing stone. Several densities in the left renal calices may reflect contrast, small stones or a combination. No renal masses. No hydronephrosis. There symmetric renal enhancement. Ureters are normal in course and caliber. Bladder is unremarkable. Stomach/Bowel: Bowel evaluation somewhat limited by lack of contrast and peritoneal fat. Allowing for this, there is no bowel dilation. There is no wall thickening or adjacent inflammation. Stomach is unremarkable. Appendix not definitively seen,  but there is no evidence of appendicitis. Vascular/Lymphatic: No significant vascular findings are present. No enlarged abdominal or pelvic lymph nodes. Reproductive: Prostate is unremarkable. Other: No abdominal wall hernia or abnormality. No abdominopelvic ascites. Musculoskeletal: No acute or significant osseous findings. IMPRESSION: 1. Gallbladder wall is thickened to 5 mm. The etiology of this is unclear. Recommend follow-up limited right upper quadrant ultrasound for further assessment. 2. No other abnormalities. No other findings to account for the patient's symptoms. Electronically Signed   By: Amie Portland M.D.   On: 08/07/2017 17:47   US Abdomen Limited Ruq  Result Date: 08/07/2017 CLINICAL DATA:  Initial evaluation for right upper quadrant pain with vomiting. EXAM: ULTRASOUND ABDOMEN LIMITED RIGHT UPPER QUADRANT COMPARISON:  Prior CT from earlier the same day. FINDINGS: Gallbladder: No echogenic stones seen within the gallbladder lumen. Few small polyps noted, largest of which measures 4.2 mm. These are felt to be incidental in nature and of doubtful significance. Gallbladder wall mildly thickened up to 4.4 mm. No free pericholecystic fluid. Evaluation for sonographic Eulah Pont sign limited as the patient was medicated. Common bile duct: Diameter: 5.6 mm Liver: No focal lesion identified. Within normal limits in parenchymal echogenicity. Portal vein is patent on color Doppler imaging with normal direction of blood flow towards the liver. IMPRESSION: 1. Mild gallbladder wall thickening without additional sonographic features for acute cholecystitis. No cholelithiasis identified. 2. No biliary dilatation. 3. Few subcentimeter gallbladder polyps measuring up  to 4 mm. These are felt to be incidental in nature, and of no clinical significance. No follow-up imaging recommended regarding these findings. Electronically Signed   By: Rise Mu M.D.   On: 08/07/2017 19:03     Labs: Basic Metabolic  Panel:  Recent Labs Lab 08/07/17 1541 08/08/17 0616  NA 140 142  K 3.9 3.8  CL 104 107  CO2 26 26  GLUCOSE 116* 82  BUN 9 11  CREATININE 1.09 1.03  CALCIUM 9.7 9.4   Liver Function Tests:  Recent Labs Lab 08/07/17 1541 08/08/17 0616  AST 67* 61*  ALT 29 26  ALKPHOS 76 71  BILITOT 0.8 0.6  PROT 7.2 6.5  ALBUMIN 4.5 4.0    Recent Labs Lab 08/07/17 1541  LIPASE 21   CBC:  Recent Labs Lab 08/07/17 1541 08/08/17 0616  WBC 24.7* 23.3*  NEUTROABS 23.0*  --   HGB 13.3 12.1*  HCT 40.3 36.3*  MCV 82.6 83.1  PLT 252 285    Principal Problem:   Abdominal pain with vomiting Active Problems:   G6PD deficiency (HCC)   Leukocytosis   Intractable nausea and vomiting   Time coordinating discharge: 35 minutes  Signed:  Brendia Sacks, MD [facility] 08/08/2017, 7:14 PM

## 2017-08-08 NOTE — Care Management Note (Signed)
Case Management Note  Patient Details  Name: CID AGENA MRN: [id number] Date of Birth: Apr 06, 1996  Subjective/Objective:                  Sickle cell pain  Action/Plan: Date:  August 08, 2017 Chart reviewed for concurrent status and case management needs.  Will continue to follow patient progress.  Discharge Planning: following for needs  Expected discharge date: [id number]  Marcelle Smiling, BSN, [place], [place]   [phone]   Expected Discharge Date:   (unsure)               Expected Discharge Plan:  Home/Self Care  In-House Referral:     Discharge planning Services  CM Consult  Post Acute Care Choice:    Choice offered to:     DME Arranged:    DME Agency:     HH Arranged:    HH Agency:     Status of Service:  In process, will continue to follow  If discussed at Long Length of Stay Meetings, dates discussed:    Additional Comments:  Golda Acre, RN 08/08/2017, 9:32 AM

## 2017-08-09 LAB — HIV ANTIBODY (ROUTINE TESTING W REFLEX): HIV Screen 4th Generation wRfx: NONREACTIVE

## 2018-01-08 ENCOUNTER — Encounter: Payer: Self-pay | Admitting: Family Medicine

## 2018-01-08 DIAGNOSIS — D573 Sickle-cell trait: Secondary | ICD-10-CM | POA: Insufficient documentation

## 2018-03-17 ENCOUNTER — Emergency Department (HOSPITAL_COMMUNITY)
Admission: EM | Admit: 2018-03-17 | Discharge: 2018-03-18 | Disposition: A | Discharge status: Home / Self Care | Payer: BLUE CROSS/BLUE SHIELD | Attending: Emergency Medicine | Admitting: Emergency Medicine

## 2018-03-17 ENCOUNTER — Emergency Department (HOSPITAL_COMMUNITY): Payer: BLUE CROSS/BLUE SHIELD

## 2018-03-17 ENCOUNTER — Other Ambulatory Visit: Payer: Self-pay

## 2018-03-17 ENCOUNTER — Encounter (HOSPITAL_COMMUNITY): Payer: Self-pay | Admitting: Emergency Medicine

## 2018-03-17 DIAGNOSIS — F17210 Nicotine dependence, cigarettes, uncomplicated: Secondary | ICD-10-CM | POA: Insufficient documentation

## 2018-03-17 DIAGNOSIS — M79652 Pain in left thigh: Secondary | ICD-10-CM | POA: Diagnosis present

## 2018-03-17 MED ORDER — IBUPROFEN 800 MG PO TABS
800.0000 mg | ORAL_TABLET | Freq: Once | ORAL | Status: AC
  Administered 2018-03-17: 800 mg via ORAL
  Filled 2018-03-17: qty 1

## 2018-03-17 NOTE — ED Notes (Signed)
Patient transported to X-ray 

## 2018-03-17 NOTE — Discharge Instructions (Addendum)
Please use knee immobilizer Call Dr. Ranell PatrickNorris in morning for recheck this week.

## 2018-03-17 NOTE — ED Provider Notes (Signed)
[facility]-EMERGENCY DEPT Provider Note   CSN: [id number] Arrival date & time: 03/17/18  2116     History   Chief Complaint Chief Complaint  Patient presents with  . Leg Pain    HPI ADONIJAH BAENA is a 22 y.o. male.  HPI  22 year old male with left thigh pain after landing on leg tonight playing basketball.  Is complaining of pain above the left knee and in the left mid thigh.  Past Medical History:  Diagnosis Date  . Sickle cell trait [facility])     Patient Active Problem List   Diagnosis Date Noted  . Sickle cell trait (HCC) 01/08/2018  . G6PD deficiency (HCC) 08/07/2017  . Leukocytosis 08/07/2017  . Intractable nausea and vomiting 08/07/2017  . Abdominal pain with vomiting 08/07/2017    Past Surgical History:  Procedure Laterality Date  . KNEE SURGERY Left 08/2015        Home Medications    Prior to Admission medications   Medication Sig Start Date End Date Taking? Authorizing Provider  acetaminophen (TYLENOL) 500 MG tablet Take 1,000 mg by mouth every 6 (six) hours as needed for mild pain, moderate pain, fever or headache.    [provider]    Family History Family History  Problem Relation Age of Onset  . Stroke Other   . Heart failure Other   . Diabetes Other     Social History Social History   Tobacco Use  . Smoking status: Current Some Day Smoker  . Smokeless tobacco: Never Used  Substance Use Topics  . Alcohol use: No  . Drug use: Yes    Types: Marijuana     Allergies   Blueberry flavor and Sulfa antibiotics   Review of Systems Review of Systems  All other systems reviewed and are negative.    Physical Exam Updated Vital Signs BP 112/66 (BP Location: Left Arm)   Pulse 78   Temp 99.4 F (37.4 C) (Oral)   Resp 14   Ht 1.727 m (5\' 8" )   Wt 77.1 kg (170 lb)   SpO2 99%   BMI 25.85 kg/m   Physical Exam  Constitutional: He is oriented to person, place, and time. He appears well-developed  and well-nourished.  HENT:  Head: Normocephalic and atraumatic.  Eyes: Pupils are equal, round, and reactive to light. EOM are normal.  Neck: Normal range of motion.  Cardiovascular: Normal rate.  Pulmonary/Chest: Effort normal.  Musculoskeletal:  Tenderness palpation left mid thigh and above left knee. He is unable to hold his knee extended off the bed. Patellar tendon appears intact. He may have some defect in the left suprapatellar patellar lateral area and is diffusely tender in the lateral thigh  Neurological: He is alert and oriented to person, place, and time.  Skin: Skin is warm. Capillary refill takes less than 2 seconds.  Psychiatric: He has a normal mood and affect.  Nursing note and vitals reviewed.    ED Treatments / Results  Labs (all labs ordered are listed, but only abnormal results are displayed) Labs Reviewed - No data to display  EKG None  Radiology No results found.  Procedures Procedures (including critical care time)  Medications Ordered in ED Medications - No data to display   Initial Impression / Assessment and Plan / ED Course  I have reviewed the triage vital signs and the nursing notes.  Pertinent labs & imaging results that were available during my care of the patient were reviewed by me  and considered in my medical decision making (see chart for details).     Pain left thigh and knee with decreased extension.  Concern for partial muscle tear or tendon injury.  Plan knee immobilizer and follow up with ortho Final Clinical Impressions(s) / ED Diagnoses   Final diagnoses:  Left thigh pain    ED Discharge Orders    None       Margarita Grizzleay, Izza Bickle, MD 03/17/18 2337

## 2018-03-17 NOTE — ED Triage Notes (Signed)
Pt states he was playing basketball and came down on his left leg wrong  Pt has swelling and bruising noted to his left thigh

## 2018-04-10 IMAGING — CT CT ABD-PELV W/ CM
2 of 4 series · 15 of 46 positions shown, 17 images · IV contrast (ISOVUE)
Comparison: None.

CLINICAL DATA: Per EMS. Pt from [facility]. Pt reports he has had n/v
and generalized abd pain since yesterday. Pt went to football
practice which made his symptoms worse. UC gave pt 25mg phenergan
and 60mg tordol prior to arrival. Pt reports nausea medication has
not helped.Pt actively vomiting/chills in CT

EXAM:
CT ABDOMEN AND PELVIS WITH CONTRAST
TECHNIQUE: Multidetector CT imaging of the abdomen and pelvis was performed
using the standard protocol following bolus administration of
intravenous contrast.
CONTRAST:  100mL [id number] IOPAMIDOL (1112VM-433) INJECTION 61%

[Series 2: abd/pel with · axial · 0.67mm/px · z∈[+928,+1313]mm · 12 of 89 slices shown, 14 images]
[im 6/89  soft-tissue]
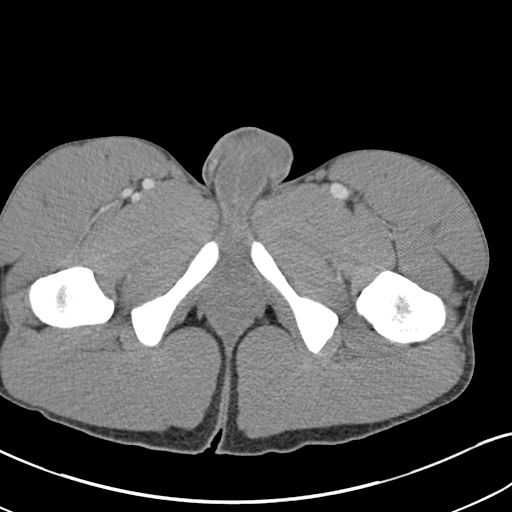
[im 6/89  bone]
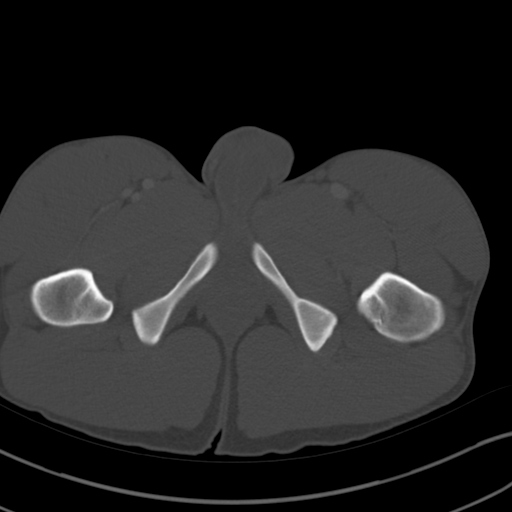
[im 16/89  soft-tissue]
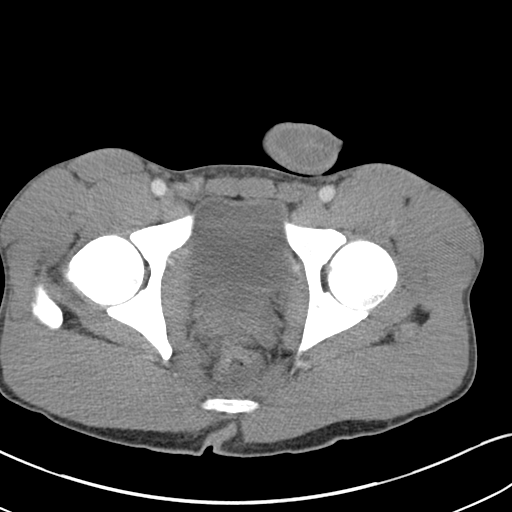
[im 21/89  soft-tissue]
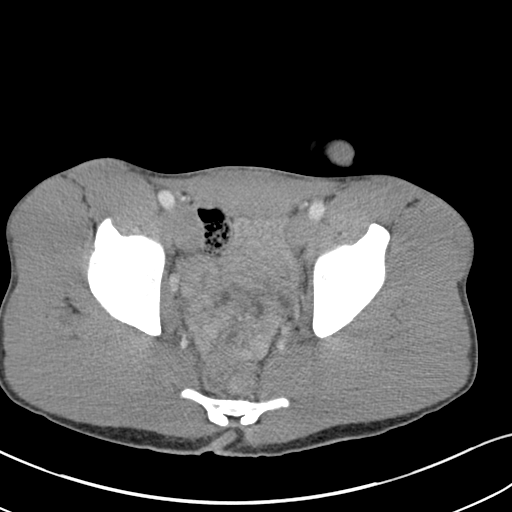
[im 26/89  soft-tissue]
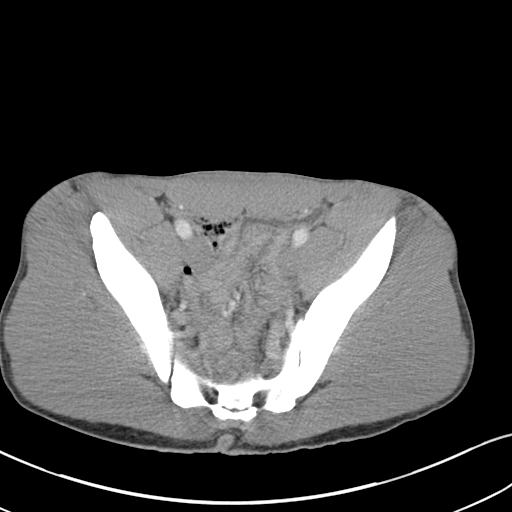
[im 37/89  soft-tissue]
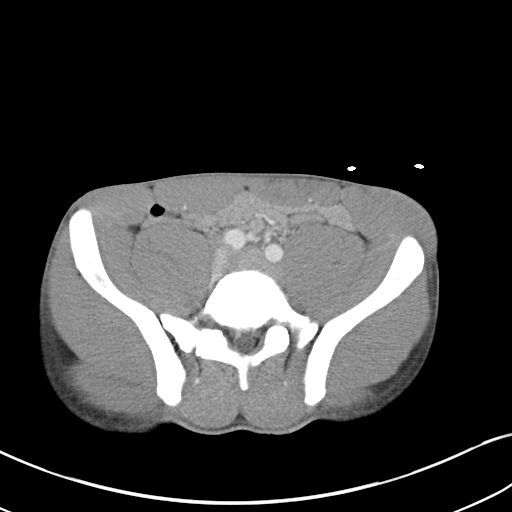
[im 42/89  soft-tissue]
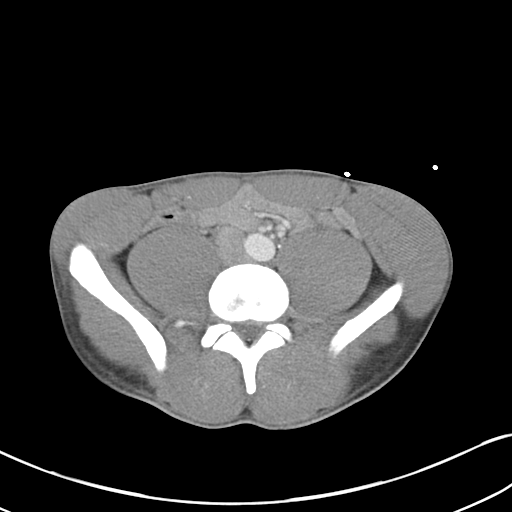
[im 47/89  soft-tissue]
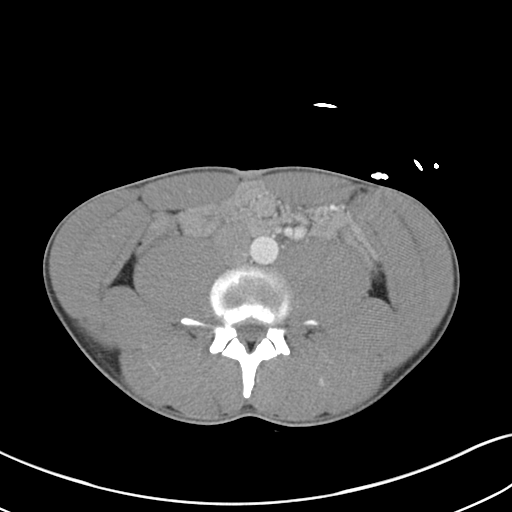
[im 57/89  soft-tissue]
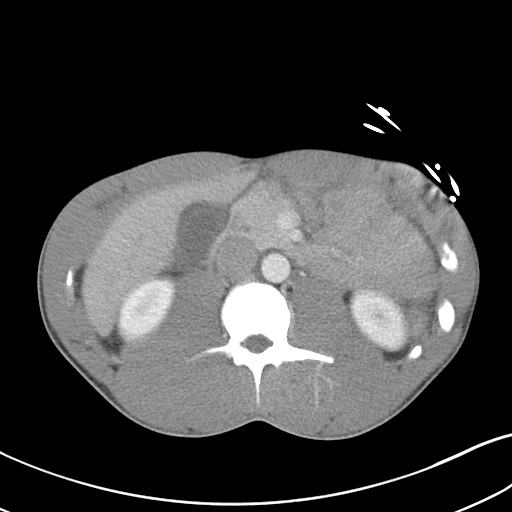
[im 63/89  soft-tissue]
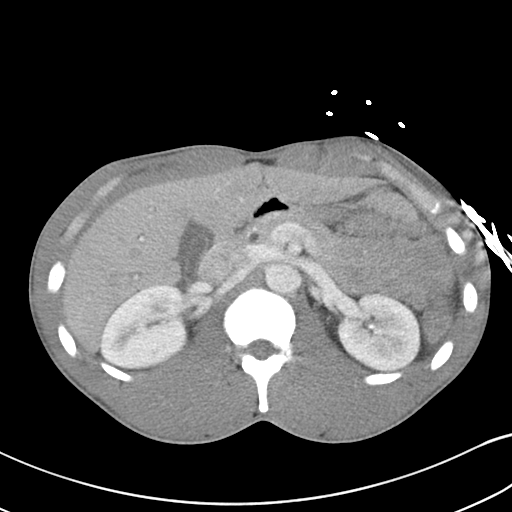
[im 63/89  bone]
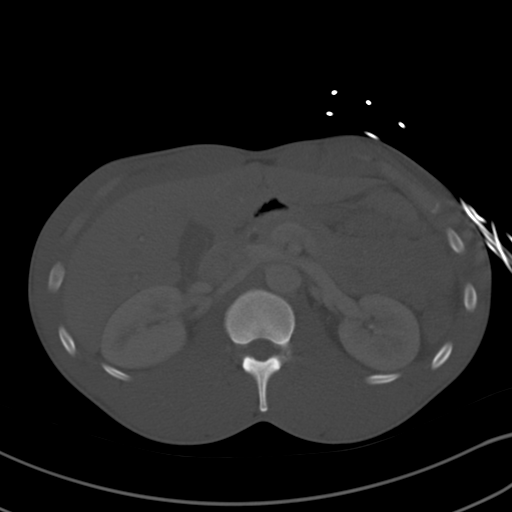
[im 68/89  soft-tissue]
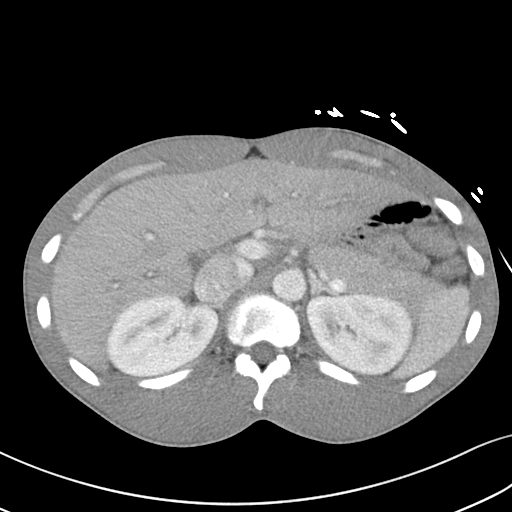
[im 78/89  soft-tissue]
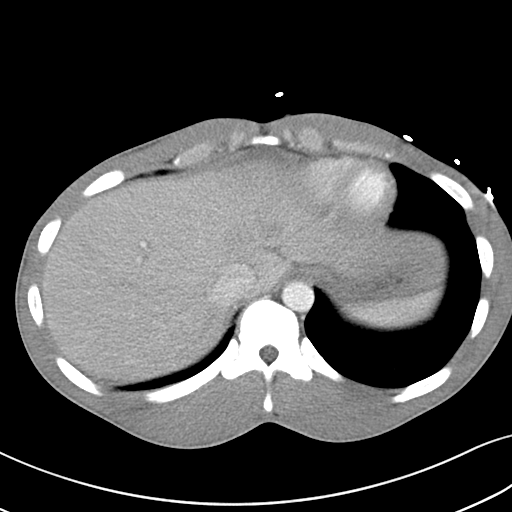
[im 83/89  soft-tissue]
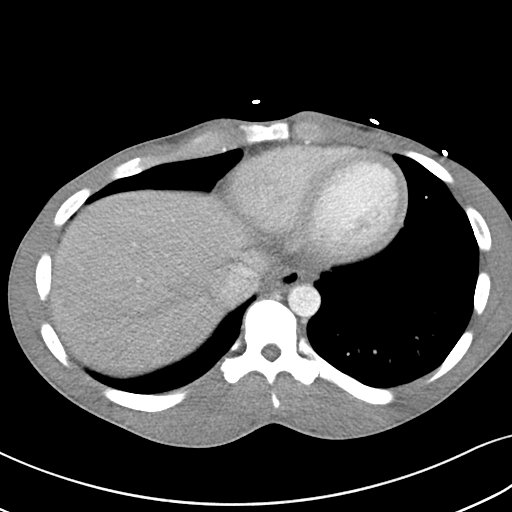

[Series 5: coronal a/|p · coronal · 0.70mm/px · 3 of 111 slices shown]
[im 37/111  soft-tissue]
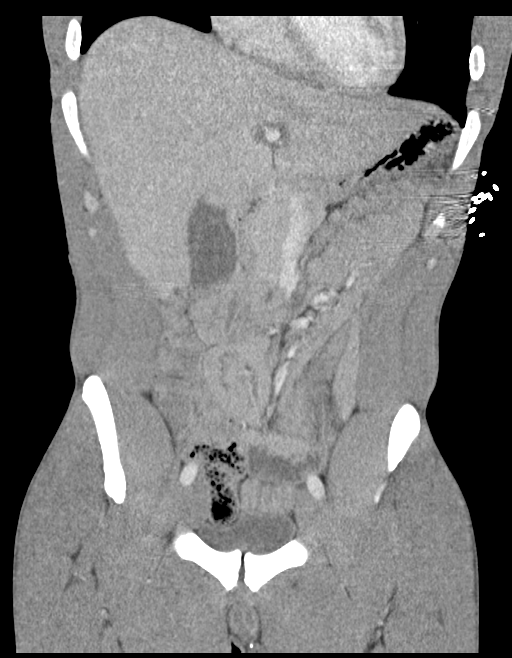
[im 49/111  soft-tissue]
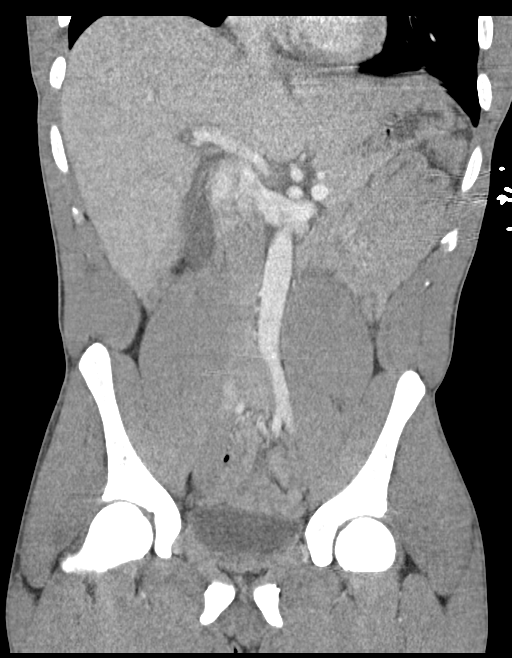
[im 62/111  soft-tissue]
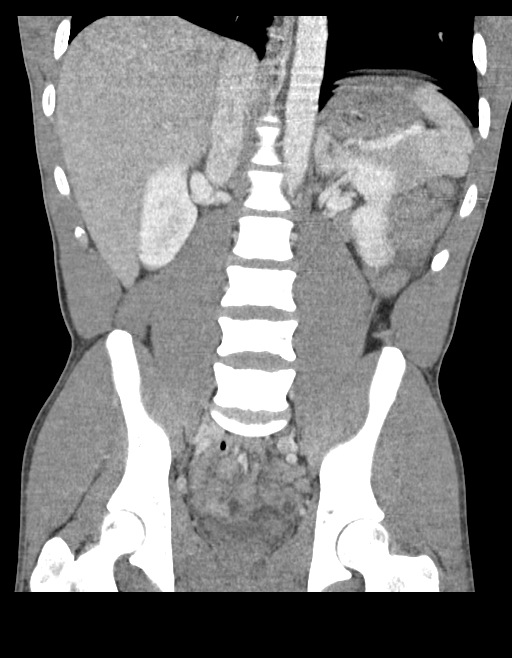

[15 of 46 positions shown; findings below may reference images not displayed]

FINDINGS: Lower chest: Clear lung bases.  Heart is normal in size.

Hepatobiliary: Normal liver. There is thickening of the wall of the
gallbladder to approximately 5 mm. No CT evidence of a gallstone no
bile duct dilation. No adjacent inflammation.

Pancreas: Unremarkable. No pancreatic ductal dilatation or
surrounding inflammatory changes.

Spleen: Normal in size without focal abnormality.

Adrenals/Urinary Tract: No adrenal masses. Tiny density in the
midpole the right kidney that is likely a nonobstructing stone.
Several densities in the left renal calices may reflect contrast,
small stones or a combination. No renal masses. No hydronephrosis.
There symmetric renal enhancement. Ureters are normal in course and
caliber. Bladder is unremarkable.

Stomach/Bowel: Bowel evaluation somewhat limited by lack of contrast
and peritoneal fat. Allowing for this, there is no bowel dilation.
There is no wall thickening or adjacent inflammation. Stomach is
unremarkable. Appendix not definitively seen, but there is no
evidence of appendicitis.

Vascular/Lymphatic: No significant vascular findings are present. No
enlarged abdominal or pelvic lymph nodes.

Reproductive: Prostate is unremarkable.

Other: No abdominal wall hernia or abnormality. No abdominopelvic
ascites.

Musculoskeletal: No acute or significant osseous findings.
IMPRESSION: 1. Gallbladder wall is thickened to 5 mm. The etiology of this is
unclear. Recommend follow-up limited right upper quadrant ultrasound
for further assessment.
2. No other abnormalities. No other findings to account for the
patient's symptoms.

## 2018-12-30 ENCOUNTER — Ambulatory Visit (INDEPENDENT_AMBULATORY_CARE_PROVIDER_SITE_OTHER): Payer: BLUE CROSS/BLUE SHIELD

## 2018-12-30 ENCOUNTER — Other Ambulatory Visit: Payer: Self-pay

## 2018-12-30 ENCOUNTER — Ambulatory Visit
Admission: EM | Admit: 2018-12-30 | Discharge: 2018-12-30 | Disposition: A | Discharge status: Home / Self Care | Payer: BLUE CROSS/BLUE SHIELD | Attending: Family Medicine | Admitting: Family Medicine

## 2018-12-30 ENCOUNTER — Encounter: Payer: Self-pay | Admitting: Emergency Medicine

## 2018-12-30 DIAGNOSIS — Z23 Encounter for immunization: Secondary | ICD-10-CM | POA: Diagnosis not present

## 2018-12-30 DIAGNOSIS — M79641 Pain in right hand: Secondary | ICD-10-CM

## 2018-12-30 DIAGNOSIS — M79642 Pain in left hand: Secondary | ICD-10-CM

## 2018-12-30 DIAGNOSIS — S60519A Abrasion of unspecified hand, initial encounter: Secondary | ICD-10-CM

## 2018-12-30 DIAGNOSIS — T148XXA Other injury of unspecified body region, initial encounter: Secondary | ICD-10-CM

## 2018-12-30 DIAGNOSIS — M25512 Pain in left shoulder: Secondary | ICD-10-CM

## 2018-12-30 DIAGNOSIS — S60229A Contusion of unspecified hand, initial encounter: Secondary | ICD-10-CM

## 2018-12-30 MED ORDER — TETANUS-DIPHTH-ACELL PERTUSSIS 5-2.5-18.5 LF-MCG/0.5 IM SUSP
0.5000 mL | Freq: Once | INTRAMUSCULAR | Status: AC
  Administered 2018-12-30: 0.5 mL via INTRAMUSCULAR

## 2018-12-30 MED ORDER — CEPHALEXIN 500 MG PO CAPS: 500.0000 mg | ORAL_CAPSULE | Freq: Four times a day (QID) | ORAL | 0 refills | Status: AC

## 2018-12-30 MED ORDER — MUPIROCIN 2 % EX OINT: TOPICAL_OINTMENT | CUTANEOUS | 0 refills | Status: DC
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
## 2018-12-30 NOTE — Discharge Instructions (Signed)
Take medication as prescribed. Rest. Drink plenty of fluids.  Keep clean with soap and water as discussed.  Allow open air time.  Nonadherent dressings.  Over-the-counter ibuprofen and Tylenol as needed.  Follow up with your primary care physician this week as needed. Return to Urgent care for new or worsening concerns.
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
## 2019-01-04 ENCOUNTER — Encounter: Payer: Self-pay | Admitting: Emergency Medicine

## 2019-01-04 ENCOUNTER — Ambulatory Visit
Admission: EM | Admit: 2019-01-04 | Discharge: 2019-01-04 | Disposition: A | Discharge status: Home / Self Care | Payer: BLUE CROSS/BLUE SHIELD | Attending: Family Medicine | Admitting: Family Medicine

## 2019-01-04 ENCOUNTER — Other Ambulatory Visit: Payer: Self-pay

## 2019-01-04 DIAGNOSIS — Z202 Contact with and (suspected) exposure to infections with a predominantly sexual mode of transmission: Secondary | ICD-10-CM

## 2019-01-04 LAB — CHLAMYDIA/NGC RT PCR (ARMC ONLY)
Chlamydia Tr: DETECTED — AB
N gonorrhoeae: NOT DETECTED

## 2019-01-04 MED ORDER — AZITHROMYCIN 500 MG PO TABS
1000.0000 mg | ORAL_TABLET | Freq: Once | ORAL | Status: AC
  Administered 2019-01-04: 1000 mg via ORAL

## 2019-01-04 NOTE — ED Provider Notes (Signed)
[facility]    CSN: [id number] Arrival date & time: 01/04/19  0840   History   Chief Complaint Chief Complaint  Patient presents with  . Exposure to STD    HPI  23 year old male presents with the above complaint.  Patient states that he got a phone call from a sexual partner stating that she had chlamydia.  Patient presents today for evaluation and treatment.  He denies urinary symptoms.  No penile discharge.  He is feeling well.  Patient is asymptomatic and desires treatment today.  No other complaints.  PMH, Surgical Hx, Family Hx, Social History reviewed and updated as below.  Patient Active Problem List   Diagnosis Date Noted  . Sickle cell trait (HCC) 01/08/2018  . G6PD deficiency 08/07/2017   Past Surgical History:  Procedure Laterality Date  . KNEE SURGERY Left 08/2015   Home Medications    Prior to Admission medications   Medication Sig Start Date End Date Taking? Authorizing Provider  cephALEXin (KEFLEX) 500 MG capsule Take 1 capsule (500 mg total) by mouth 4 (four) times daily for 7 days. 12/30/18 01/06/19 Yes Renford Dills, NP  mupirocin ointment (BACTROBAN) 2 % Apply two times a day for 7 days. 12/30/18  Yes Renford Dills, NP   Family History Family History  Problem Relation Age of Onset  . Stroke Other   . Heart failure Other   . Diabetes Other    Social History Social History   Tobacco Use  . Smoking status: Former Games developer  . Smokeless tobacco: Never Used  Substance Use Topics  . Alcohol use: No  . Drug use: Yes    Types: Marijuana   Allergies   Blueberry flavor and Sulfa antibiotics   Review of Systems Review of Systems  Constitutional: Negative.   Genitourinary: Negative.    Physical Exam Triage Vital Signs ED Triage Vitals  Enc Vitals Group     BP 01/04/19 0900 115/77     Pulse Rate 01/04/19 0900 63     Resp 01/04/19 0900 18     Temp 01/04/19 0900 98.3 F (36.8 C)     Temp Source 01/04/19 0900 Oral     SpO2 01/04/19  0900 100 %     Weight 01/04/19 0858 170 lb (77.1 kg)     Height 01/04/19 0858 5\' 8"  (1.727 m)     Head Circumference --      Peak Flow --      Pain Score 01/04/19 0858 0     Pain Loc --      Pain Edu? --      Excl. in GC? --    Updated Vital Signs BP 115/77 (BP Location: Right Arm)   Pulse 63   Temp 98.3 F (36.8 C) (Oral)   Resp 18   Ht 5\' 8"  (1.727 m)   Wt 77.1 kg   SpO2 100%   BMI 25.85 kg/m   Visual Acuity Right Eye Distance:   Left Eye Distance:   Bilateral Distance:    Right Eye Near:   Left Eye Near:    Bilateral Near:     Physical Exam Vitals signs and nursing note reviewed.  Constitutional:      General: He is not in acute distress.    Appearance: Normal appearance.  HENT:     Head: Normocephalic and atraumatic.     Nose: Nose normal.  Eyes:     General:        Right eye: No discharge.  Left eye: No discharge.     Conjunctiva/sclera: Conjunctivae normal.  Cardiovascular:     Rate and Rhythm: Normal rate and regular rhythm.  Pulmonary:     Effort: Pulmonary effort is normal.     Breath sounds: Normal breath sounds.  Abdominal:     General: There is no distension.     Palpations: Abdomen is soft.     Tenderness: There is no abdominal tenderness.  Neurological:     Mental Status: He is alert.  Psychiatric:        Mood and Affect: Mood normal.        Behavior: Behavior normal.    UC Treatments / Results  Labs (all labs ordered are listed, but only abnormal results are displayed) Labs Reviewed  CHLAMYDIA/NGC RT PCR [facility] ONLY)    EKG None  Radiology No results found.  Procedures Procedures (including critical care time)  Medications Ordered in UC Medications  azithromycin (ZITHROMAX) tablet 1,000 mg (1,000 mg Oral Given 01/04/19 0946)    Initial Impression / Assessment and Plan / UC Course  I have reviewed the triage vital signs and the nursing notes.  Pertinent labs & imaging results that were available during my  care of the patient were reviewed by me and considered in my medical decision making (see chart for details).    22 year old male presents with exposure to chlamydia.  Treating empirically with azithromycin today.  Awaiting results of STD testing.  Final Clinical Impressions(s) / UC Diagnoses   Final diagnoses:  Exposure to STD     Discharge Instructions     We will call with the results.  Take care  Dr. Adriana Simas    ED Prescriptions    None     Controlled Substance Prescriptions [place] Controlled Substance Registry consulted? Not Applicable   Tommie Sams, DO 01/04/19 1011

## 2019-01-04 NOTE — Discharge Instructions (Signed)
We will call with the results.  Take care  Dr. Delayna Sparlin  

## 2019-01-04 NOTE — ED Triage Notes (Signed)
Patient states he needs treatment for chlamydia. He received a call that his partner had tested positive for chlamydia.

## 2019-01-05 ENCOUNTER — Telehealth (HOSPITAL_COMMUNITY): Payer: Self-pay | Admitting: Emergency Medicine

## 2019-01-05 NOTE — Telephone Encounter (Signed)
Patient contacted and made aware of all results, all questions answered.   

## 2019-01-05 NOTE — Telephone Encounter (Signed)
Chlamydia is positive.  This was treated at the urgent care visit with po zithromax 1g.  Pt needs education to please refrain from sexual intercourse for 7 days to give the medicine time to work.  Sexual partners need to be notified and tested/treated.  Condoms may reduce risk of reinfection.  Recheck or followup with PCP for further evaluation if symptoms are not improving.  [facility] notified.  Attempted to reach patient. No answer at this time. Voicemail left.    

## 2019-02-23 ENCOUNTER — Other Ambulatory Visit: Payer: Self-pay

## 2019-02-23 ENCOUNTER — Encounter: Payer: Self-pay | Admitting: Emergency Medicine

## 2019-02-23 ENCOUNTER — Emergency Department
Admission: EM | Admit: 2019-02-23 | Discharge: 2019-02-23 | Disposition: A | Discharge status: Home / Self Care | Payer: BLUE CROSS/BLUE SHIELD | Attending: Emergency Medicine | Admitting: Emergency Medicine

## 2019-02-23 DIAGNOSIS — Y93G1 Activity, food preparation and clean up: Secondary | ICD-10-CM | POA: Diagnosis not present

## 2019-02-23 DIAGNOSIS — B351 Tinea unguium: Secondary | ICD-10-CM

## 2019-02-23 DIAGNOSIS — S6992XA Unspecified injury of left wrist, hand and finger(s), initial encounter: Secondary | ICD-10-CM | POA: Diagnosis present

## 2019-02-23 DIAGNOSIS — S61012A Laceration without foreign body of left thumb without damage to nail, initial encounter: Secondary | ICD-10-CM

## 2019-02-23 DIAGNOSIS — Z87891 Personal history of nicotine dependence: Secondary | ICD-10-CM | POA: Diagnosis not present

## 2019-02-23 DIAGNOSIS — Y929 Unspecified place or not applicable: Secondary | ICD-10-CM | POA: Diagnosis not present

## 2019-02-23 DIAGNOSIS — Y999 Unspecified external cause status: Secondary | ICD-10-CM | POA: Diagnosis not present

## 2019-02-23 DIAGNOSIS — W260XXA Contact with knife, initial encounter: Secondary | ICD-10-CM | POA: Diagnosis not present

## 2019-02-23 MED ORDER — EFINACONAZOLE 10 % EX SOLN: 1.0000 "application " | Freq: Every day | CUTANEOUS | 6 refills | Status: AC

## 2019-02-23 NOTE — ED Notes (Signed)
Pt has laceration to left thumb.  Bleeding controlled.  Pt cut thumb on a knife.

## 2019-02-23 NOTE — ED Triage Notes (Signed)
Patient ambulatory to triage with steady gait, without difficulty or distress noted; pt reports cutting corn with knife, cut left thumb

## 2019-02-23 NOTE — ED Provider Notes (Signed)
[facility] Emergency Department Provider Note  ____________________________________________  Time seen: Approximately 9:34 PM  I have reviewed the triage vital signs and the nursing notes.   HISTORY  Chief Complaint Laceration    HPI Harry Singleton is a 23 y.o. male who presents the emergency department complaining of laceration to the left thumb.  Patient was using a knife to cut an area of corn when the knife slipped and cut his finger.  Bleeding was easily controlled with direct pressure.  Patient has full range of motion to the left hand.  Patient reports that his last tetanus shot was a month ago.  No other injury or complaint.  No medications prior to arrival.         Past Medical History:  Diagnosis Date  . Sickle cell trait [facility])     Patient Active Problem List   Diagnosis Date Noted  . Sickle cell trait (HCC) 01/08/2018  . G6PD deficiency 08/07/2017    Past Surgical History:  Procedure Laterality Date  . KNEE SURGERY Left 08/2015    Prior to Admission medications   Medication Sig Start Date End Date Taking? Authorizing Provider  Efinaconazole 10 % SOLN Apply 1 application topically daily. Apply 1 drop to the end of the fingernail, use applicator to spread medication over the fingernail.  Apply for 48 weeks. 02/23/19 01/25/20  Latayna Ritchie, Delorise Royals, PA-C  mupirocin ointment (BACTROBAN) 2 % Apply two times a day for 7 days. 12/30/18   Renford Dills, NP    Allergies Blueberry flavor and Sulfa antibiotics  Family History  Problem Relation Age of Onset  . Stroke Other   . Heart failure Other   . Diabetes Other     Social History Social History   Tobacco Use  . Smoking status: Former Games developer  . Smokeless tobacco: Never Used  Substance Use Topics  . Alcohol use: No  . Drug use: Yes    Types: Marijuana     Review of Systems  Constitutional: No fever/chills Eyes: No visual changes. No discharge ENT: No upper respiratory  complaints. Cardiovascular: no chest pain. Respiratory: no cough. No SOB. Gastrointestinal: No abdominal pain.  No nausea, no vomiting.   Musculoskeletal: Negative for musculoskeletal pain. Skin: Positive for laceration to left thumb Neurological: Negative for headaches, focal weakness or numbness. 10-point ROS otherwise negative.  ____________________________________________   PHYSICAL EXAM:  VITAL SIGNS: ED Triage Vitals  Enc Vitals Group     BP 02/23/19 2050 125/66     Pulse Rate 02/23/19 2050 73     Resp 02/23/19 2050 16     Temp 02/23/19 2050 97.8 F (36.6 C)     Temp Source 02/23/19 2050 Oral     SpO2 02/23/19 2050 97 %     Weight 02/23/19 2048 170 lb (77.1 kg)     Height 02/23/19 2048 5\' 8"  (1.727 m)     Head Circumference --      Peak Flow --      Pain Score 02/23/19 2048 10     Pain Loc --      Pain Edu? --      Excl. in GC? --      Constitutional: Alert and oriented. Well appearing and in no acute distress. Eyes: Conjunctivae are normal. PERRL. EOMI. Head: Atraumatic. Neck: No stridor.    Cardiovascular: Normal rate, regular rhythm. Normal S1 and S2.  Good peripheral circulation. Respiratory: Normal respiratory effort without tachypnea or retractions. Lungs CTAB. Good air entry  to the bases with no decreased or absent breath sounds. Musculoskeletal: Full range of motion to all extremities. No gross deformities appreciated.  Visualization of the third digit of the right hand reveals nail changes consistent with onychomycosis.  No surrounding erythema or edema.  No purulent drainage.  No evidence of paronychia or felon's. Neurologic:  Normal speech and language. No gross focal neurologic deficits are appreciated.  Skin:  Skin is warm, dry and intact. No rash noted.  Positive for laceration to the left thumb.  This is approximately 1 cm in length.  No active bleeding.  No foreign body.  Full range of motion to the digit.  Laceration appears relatively superficial in  nature.  Sensation and capillary refill intact distally. Psychiatric: Mood and affect are normal. Speech and behavior are normal. Patient exhibits appropriate insight and judgement.   ____________________________________________   LABS (all labs ordered are listed, but only abnormal results are displayed)  Labs Reviewed - No data to display ____________________________________________  EKG   ____________________________________________  RADIOLOGY   No results found.  ____________________________________________    PROCEDURES  Procedure(s) performed:    Marland KitchenMarland KitchenLaceration Repair Date/Time: 02/23/2019 9:54 PM Performed by: Racheal Patches, PA-C Authorized by: Racheal Patches, PA-C   Consent:    Consent obtained:  Verbal   Consent given by:  Patient   Risks discussed:  Pain Anesthesia (see MAR for exact dosages):    Anesthesia method:  None Laceration details:    Location:  Finger   Finger location:  L thumb   Length (cm):  1 Repair type:    Repair type:  Simple Exploration:    Hemostasis achieved with:  Direct pressure   Wound exploration: entire depth of wound probed and visualized     Wound extent: no foreign bodies/material noted, no nerve damage noted, no tendon damage noted, no underlying fracture noted and no vascular damage noted     Contaminated: no   Treatment:    Area cleansed with:  Betadine   Amount of cleaning:  Standard   Irrigation solution:  Sterile saline   Irrigation volume:  500 ml   Irrigation method:  Syringe Skin repair:    Repair method:  Tissue adhesive Approximation:    Approximation:  Close Post-procedure details:    Dressing:  Open (no dressing)   Patient tolerance of procedure:  Tolerated well, no immediate complications      Medications - No data to display   ____________________________________________   INITIAL IMPRESSION / ASSESSMENT AND PLAN / ED COURSE  Pertinent labs & imaging results that were  available during my care of the patient were reviewed by me and considered in my medical decision making (see chart for details).  Review of the [place] CSRS was performed in accordance of the [facility] prior to dispensing any controlled drugs.           Patient's diagnosis is consistent with laceration to the left thumb as well as onychomycosis of the third digit right hand.  Patient presented to the emergency department with primary complaint of laceration to the left thumb.  This was closed as described above no complications.  Wound care discussed with the patient.  Patient also had an incidental finding of onychomycosis to the third fingernail right hand.  Patient will be prescribed efinaconazole topical for onychomycosis.  Follow-up with primary care as needed.. Patient is given ED precautions to return to the ED for any worsening or new symptoms.     ____________________________________________  FINAL  CLINICAL IMPRESSION(S) / ED DIAGNOSES  Final diagnoses:  Laceration of left thumb without foreign body without damage to nail, initial encounter  Onychomycosis      NEW MEDICATIONS STARTED DURING THIS VISIT:  ED Discharge Orders         Ordered    Efinaconazole 10 % SOLN  Daily     02/23/19 2152              This chart was dictated using voice recognition software/Dragon. Despite best efforts to proofread, errors can occur which can change the meaning. Any change was purely unintentional.    Racheal Patches, PA-C 02/23/19 2154    Phineas Semen, MD 02/23/19 2256

## 2019-03-23 IMAGING — US US ABDOMEN LIMITED
1 series · 14 of 25 positions shown · non-contrast
Comparison: Prior CT from earlier the same day.

CLINICAL DATA: Initial evaluation for right upper quadrant pain
with vomiting.

EXAM:
ULTRASOUND ABDOMEN LIMITED RIGHT UPPER QUADRANT

[Series 1: us abdomen limited · 0.25mm/px · 14 of 56 slices shown]
[im 1/56]
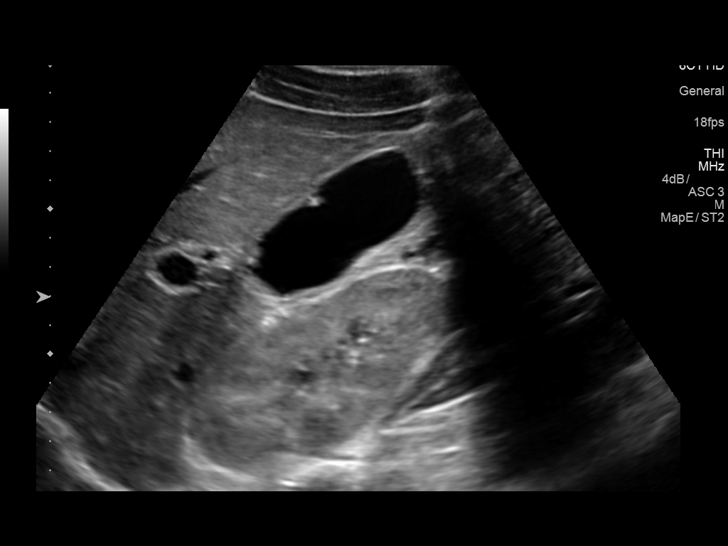
[im 5/56]
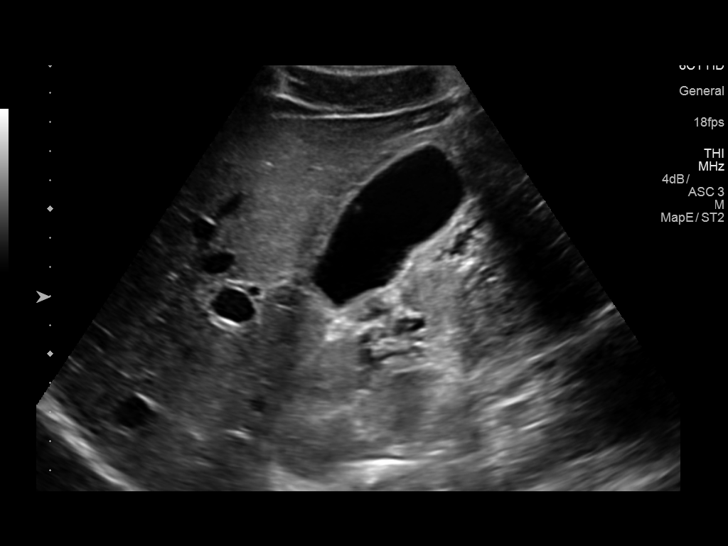
[im 10/56]
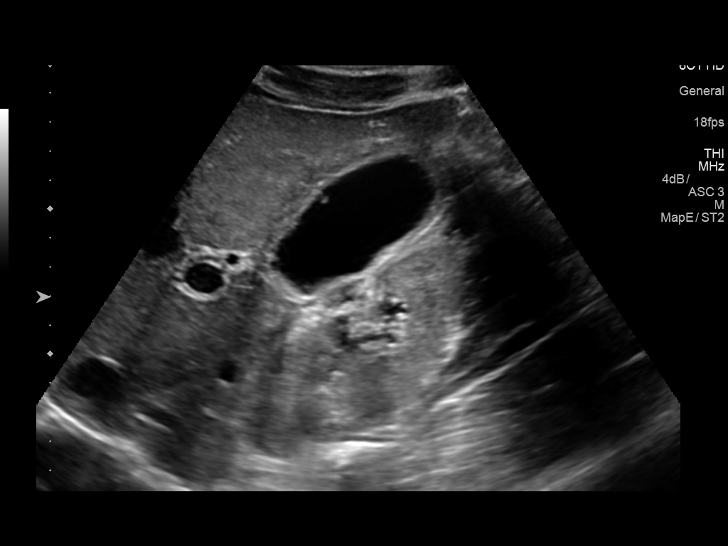
[im 14/56]
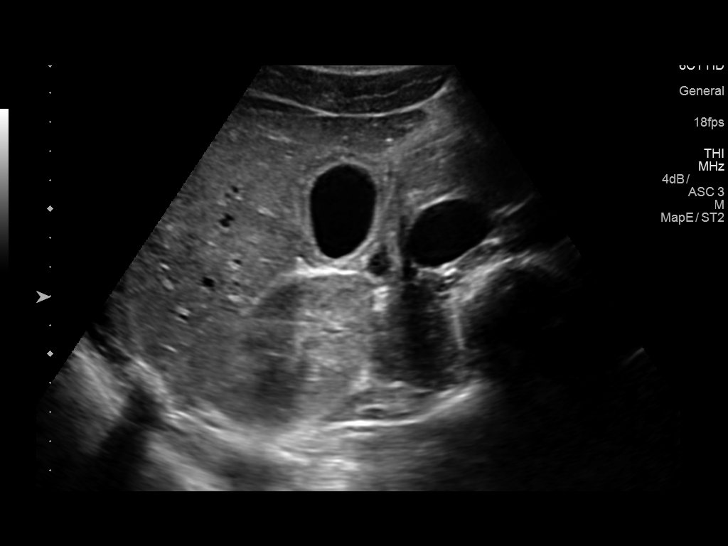
[im 19/56]
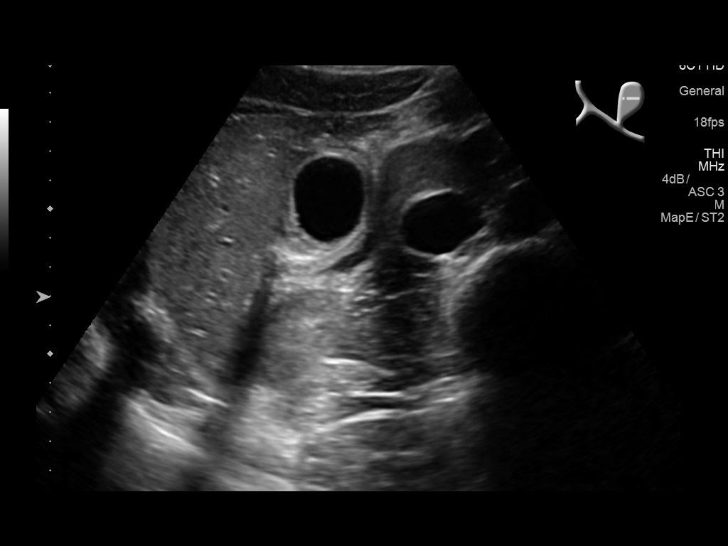
[im 21/56]
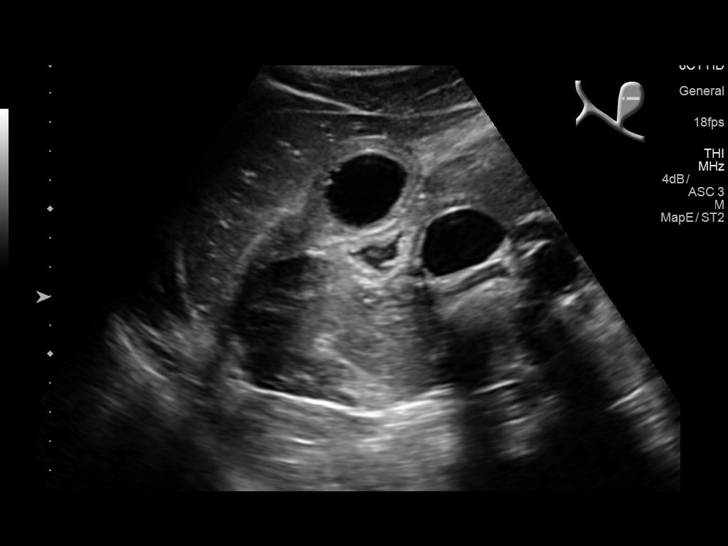
[im 26/56]
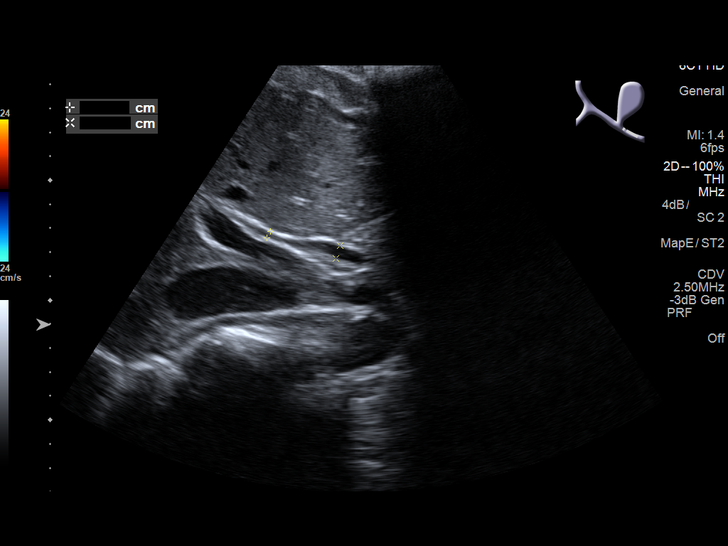
[im 30/56]
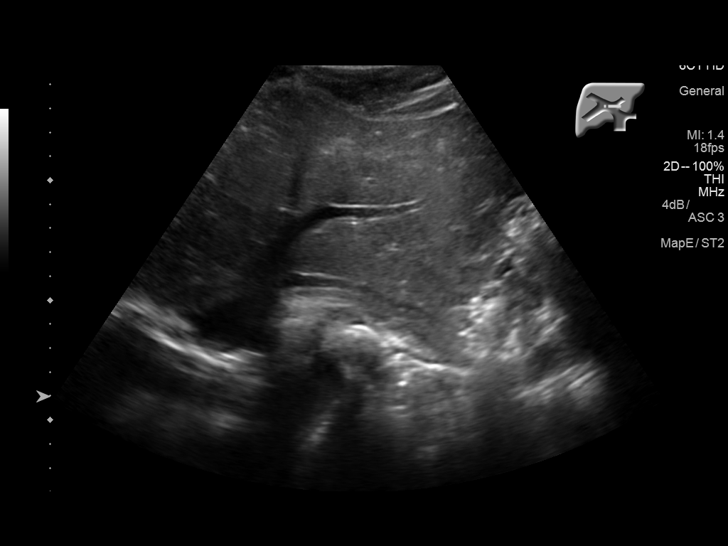
[im 35/56]
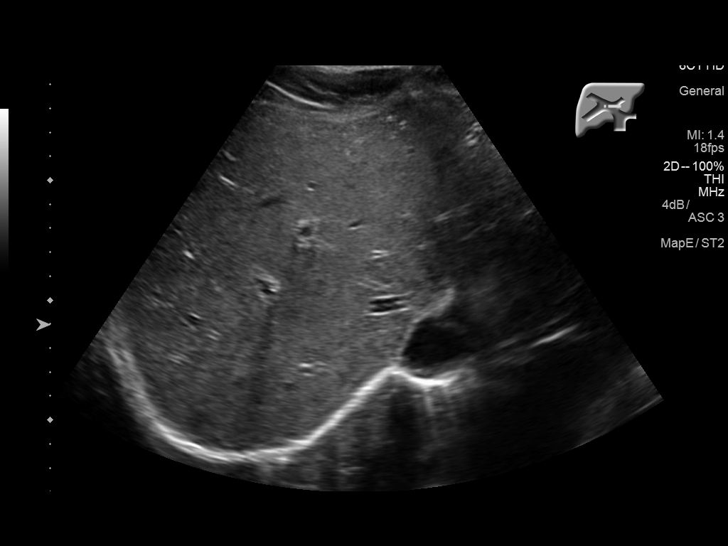
[im 37/56]
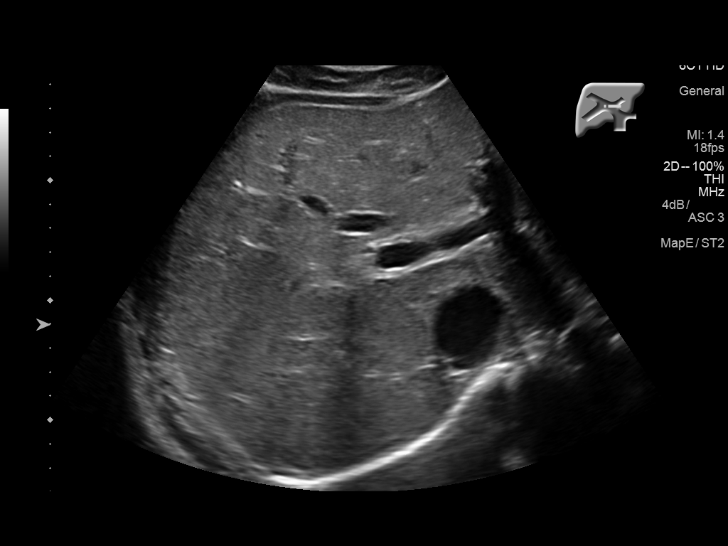
[im 42/56]
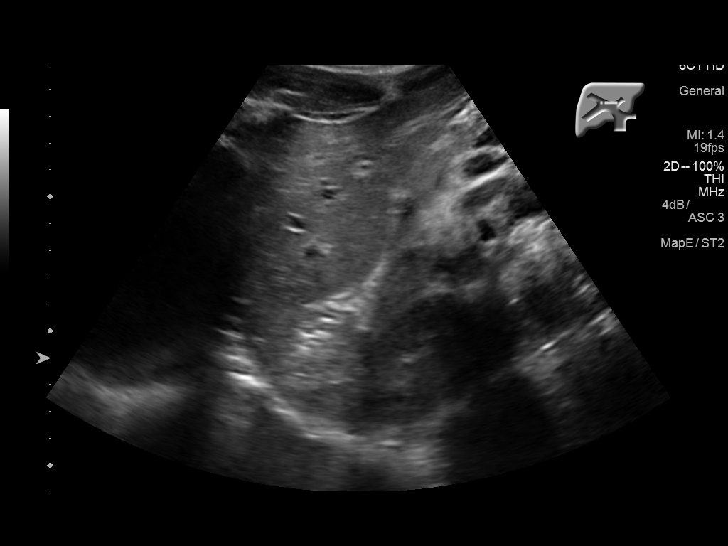
[im 46/56]
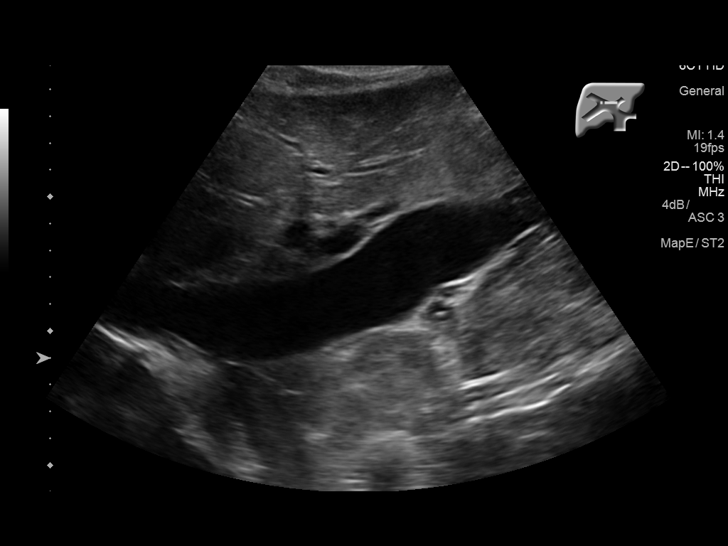
[im 51/56]
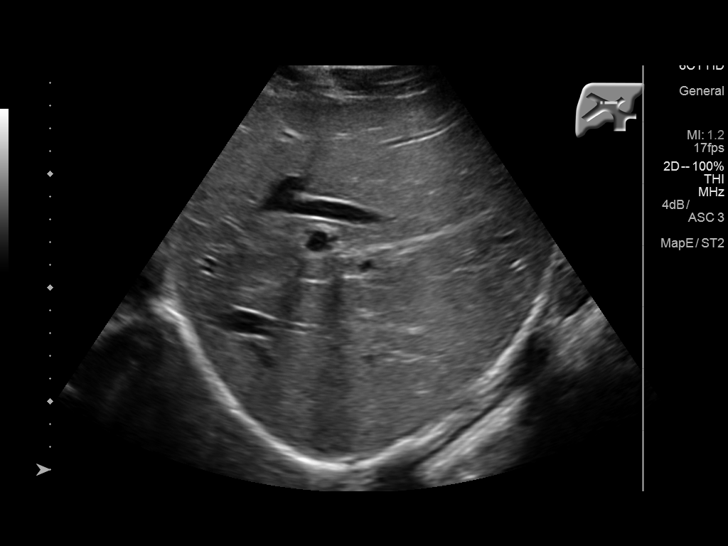
[im 56/56]
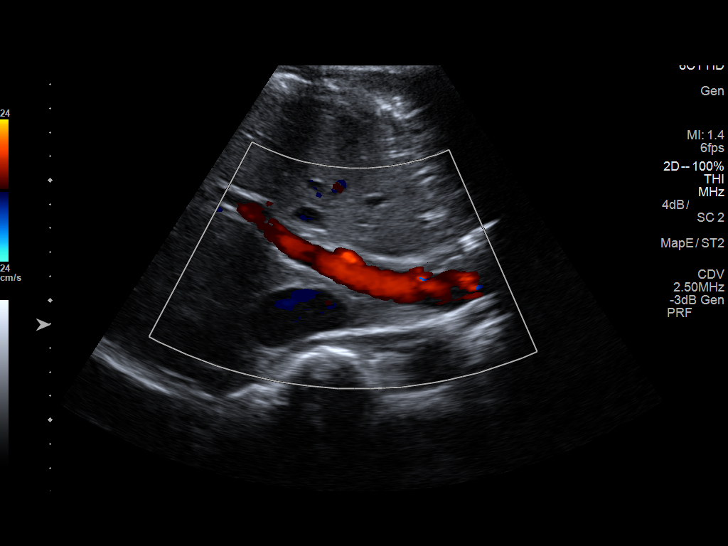

[14 of 25 positions shown; findings below may reference images not displayed]

FINDINGS: Gallbladder:

No echogenic stones seen within the gallbladder lumen. Few small
polyps noted, largest of which measures 4.2 mm. These are felt to be
incidental in nature and of doubtful significance. Gallbladder wall
mildly thickened up to 4.4 mm. No free pericholecystic fluid.
Evaluation for sonographic Murphy sign limited as the patient was
medicated.

Common bile duct:

Diameter: 5.6 mm

Liver:

No focal lesion identified. Within normal limits in parenchymal
echogenicity. Portal vein is patent on color Doppler imaging with
normal direction of blood flow towards the liver.
IMPRESSION: 1. Mild gallbladder wall thickening without additional sonographic
features for acute cholecystitis. No cholelithiasis identified.
2. No biliary dilatation.
3. Few subcentimeter gallbladder polyps measuring up to 4 mm. These
are felt to be incidental in nature, and of no clinical
significance. No follow-up imaging recommended regarding these
findings.

## 2019-08-09 ENCOUNTER — Other Ambulatory Visit: Payer: Self-pay

## 2019-08-09 ENCOUNTER — Ambulatory Visit
Admission: EM | Admit: 2019-08-09 | Discharge: 2019-08-09 | Disposition: A | Discharge status: Home / Self Care | Payer: BC Managed Care – PPO | Attending: Family Medicine | Admitting: Family Medicine

## 2019-08-09 DIAGNOSIS — Z113 Encounter for screening for infections with a predominantly sexual mode of transmission: Secondary | ICD-10-CM

## 2019-08-09 DIAGNOSIS — R195 Other fecal abnormalities: Secondary | ICD-10-CM

## 2019-08-09 NOTE — Discharge Instructions (Signed)
Follow up with Primary Care provider 

## 2019-08-09 NOTE — ED Triage Notes (Addendum)
Pt states he eats a lot of hot cheetos and thinks he has seen blood on the toilet tissue when he has a BM. Also wants to be checked for STD but denies known exposure or sx.

## 2019-08-09 NOTE — ED Provider Notes (Signed)
[facility]    CSN: [id number] Arrival date & time: 08/09/19  1858      History   Chief Complaint Chief Complaint  Patient presents with  . Blood In Stools  . Exposure to STD    HPI TAMAR LIPSCOMB is a 23 y.o. male.   23 yo male with a c/o checking for STDs. Denies any symptoms but just "want to check". Also states he's been eating a lot of hot cheetos and has noticed a change in the stool color "kind of reddish". Denies seeing bright red blood in the toilet or toilet paper, just stool color is different. Denies any pain, fevers, chills, diarrhea.      Past Medical History:  Diagnosis Date  . Sickle cell trait [facility])     Patient Active Problem List   Diagnosis Date Noted  . Sickle cell trait ([place]) 01/08/2018  . G6PD deficiency 08/07/2017    Past Surgical History:  Procedure Laterality Date  . KNEE SURGERY Left 08/2015       Home Medications    Prior to Admission medications   Medication Sig Start Date End Date Taking? Authorizing Provider  Efinaconazole 10 % SOLN Apply 1 application topically daily. Apply 1 drop to the end of the fingernail, use applicator to spread medication over the fingernail.  Apply for 48 weeks. 02/23/19 01/25/20  Cuthriell, Charline Bills, PA-C  mupirocin ointment (BACTROBAN) 2 % Apply two times a day for 7 days. 12/30/18   Marylene Land, NP    Family History Family History  Problem Relation Age of Onset  . Stroke Other   . Heart failure Other   . Diabetes Other     Social History Social History   Tobacco Use  . Smoking status: Never Smoker  . Smokeless tobacco: Never Used  Substance Use Topics  . Alcohol use: Yes    Comment: social  . Drug use: Yes    Types: Marijuana    Comment: last use today     Allergies   Blueberry flavor and Sulfa antibiotics   Review of Systems Review of Systems   Physical Exam Triage Vital Signs ED Triage Vitals  Enc Vitals Group     BP 08/09/19 1906 127/78     Pulse Rate  08/09/19 1906 73     Resp 08/09/19 1906 16     Temp 08/09/19 1906 98.4 F (36.9 C)     Temp Source 08/09/19 1906 Oral     SpO2 08/09/19 1906 100 %     Weight 08/09/19 1908 170 lb (77.1 kg)     Height 08/09/19 1908 5\' 8"  (1.727 m)     Head Circumference --      Peak Flow --      Pain Score 08/09/19 1908 0     Pain Loc --      Pain Edu? --      Excl. in [place]? --    No data found.  Updated Vital Signs BP 127/78 (BP Location: Right Arm)   Pulse 73   Temp 98.4 F (36.9 C) (Oral)   Resp 16   Ht 5\' 8"  (1.727 m)   Wt 77.1 kg   SpO2 100%   BMI 25.85 kg/m   Visual Acuity Right Eye Distance:   Left Eye Distance:   Bilateral Distance:    Right Eye Near:   Left Eye Near:    Bilateral Near:     Physical Exam Vitals signs and nursing note reviewed.  Constitutional:      General: He is not in acute distress.    Appearance: He is not toxic-appearing or diaphoretic.  Cardiovascular:     Rate and Rhythm: Normal rate.  Abdominal:     General: Bowel sounds are normal. There is no distension.     Palpations: Abdomen is soft. There is no mass.     Tenderness: There is no abdominal tenderness. There is no right CVA tenderness, left CVA tenderness, guarding or rebound.     Hernia: No hernia is present.  Genitourinary:    Penis: Normal. No discharge.      Rectum: No anal fissure or external hemorrhoid.  Neurological:     Mental Status: He is alert.      UC Treatments / Results  Labs (all labs ordered are listed, but only abnormal results are displayed) Labs Reviewed  GC/CHLAMYDIA PROBE AMP    EKG   Radiology No results found.  Procedures Procedures (including critical care time)  Medications Ordered in UC Medications - No data to display  Initial Impression / Assessment and Plan / UC Course  I have reviewed the triage vital signs and the nursing notes.  Pertinent labs & imaging results that were available during my care of the patient were reviewed by me and  considered in my medical decision making (see chart for details).      Final Clinical Impressions(s) / UC Diagnoses   Final diagnoses:  Screen for STD (sexually transmitted disease)  Abnormal stool color     Discharge Instructions     Follow up with Primary Care provider    ED Prescriptions    None      1. diagnosis reviewed with patient 2. Follow up with PCP for possible referral to GI specialist 3. Await test results 4. Follow-up prn   Controlled Substance Prescriptions  Controlled Substance Registry consulted? Not Applicable   Payton Mccallumonty, Paddy Walthall, MD 08/11/19 2021

## 2019-08-13 LAB — GC/CHLAMYDIA PROBE AMP
Chlamydia trachomatis, NAA: NEGATIVE
Neisseria Gonorrhoeae by PCR: NEGATIVE

## 2019-09-13 ENCOUNTER — Other Ambulatory Visit: Payer: Self-pay

## 2019-09-13 DIAGNOSIS — Z20822 Contact with and (suspected) exposure to covid-19: Secondary | ICD-10-CM

## 2019-09-15 LAB — NOVEL CORONAVIRUS, NAA: SARS-CoV-2, NAA: NOT DETECTED

## 2019-10-27 ENCOUNTER — Other Ambulatory Visit: Payer: Self-pay

## 2019-10-27 DIAGNOSIS — Z20822 Contact with and (suspected) exposure to covid-19: Secondary | ICD-10-CM

## 2019-10-30 LAB — NOVEL CORONAVIRUS, NAA: SARS-CoV-2, NAA: NOT DETECTED

## 2020-01-17 ENCOUNTER — Emergency Department
Admission: EM | Admit: 2020-01-17 | Discharge: 2020-01-18 | Disposition: A | Discharge status: Home / Self Care | Payer: BC Managed Care – PPO | Attending: Emergency Medicine | Admitting: Emergency Medicine

## 2020-01-17 ENCOUNTER — Other Ambulatory Visit: Payer: Self-pay

## 2020-01-17 ENCOUNTER — Encounter: Payer: Self-pay | Admitting: Emergency Medicine

## 2020-01-17 DIAGNOSIS — K529 Noninfective gastroenteritis and colitis, unspecified: Secondary | ICD-10-CM | POA: Diagnosis not present

## 2020-01-17 DIAGNOSIS — R1110 Vomiting, unspecified: Secondary | ICD-10-CM | POA: Diagnosis present

## 2020-01-17 DIAGNOSIS — Z79899 Other long term (current) drug therapy: Secondary | ICD-10-CM | POA: Insufficient documentation

## 2020-01-17 LAB — COMPREHENSIVE METABOLIC PANEL
ALT: 16 U/L (ref 0–44)
AST: 18 U/L (ref 15–41)
Albumin: 4.5 g/dL (ref 3.5–5.0)
Alkaline Phosphatase: 48 U/L (ref 38–126)
Anion gap: 5 (ref 5–15)
BUN: 10 mg/dL (ref 6–20)
CO2: 31 mmol/L (ref 22–32)
Calcium: 9.3 mg/dL (ref 8.9–10.3)
Chloride: 102 mmol/L (ref 98–111)
Creatinine, Ser: 1.15 mg/dL (ref 0.61–1.24)
GFR calc Af Amer: >60 mL/min (ref >60)
GFR calc non Af Amer: >60 mL/min (ref >60)
Glucose, Bld: 135 mg/dL — ABNORMAL HIGH (ref 70–99)
Potassium: 3.9 mmol/L (ref 3.5–5.1)
Sodium: 138 mmol/L (ref 135–145)
Total Bilirubin: 0.7 mg/dL (ref 0.3–1.2)
Total Protein: 7.2 g/dL (ref 6.5–8.1)

## 2020-01-17 LAB — CBC
HCT: 41.5 % (ref 39.0–52.0)
Hemoglobin: 13.3 g/dL (ref 13.0–17.0)
MCH: 27.5 pg (ref 26.0–34.0)
MCHC: 32 g/dL (ref 30.0–36.0)
MCV: 85.7 fL (ref 80.0–100.0)
Platelets: 263 10*3/uL (ref 150–400)
RBC: 4.84 MIL/uL (ref 4.22–5.81)
RDW: 13.2 % (ref 11.5–15.5)
WBC: 12.4 10*3/uL — ABNORMAL HIGH (ref 4.0–10.5)
nRBC: 0 % (ref 0.0–0.2)

## 2020-01-17 LAB — LIPASE, BLOOD: Lipase: 28 U/L (ref 11–51)

## 2020-01-17 MED ORDER — SODIUM CHLORIDE 0.9 % IV BOLUS
1000.0000 mL | Freq: Once | INTRAVENOUS | Status: AC
  Administered 2020-01-17: 1000 mL via INTRAVENOUS

## 2020-01-17 MED ORDER — SODIUM CHLORIDE 0.9% FLUSH: 3.0000 mL | Freq: Once | INTRAVENOUS | Status: DC

## 2020-01-17 MED ORDER — ONDANSETRON HCL 4 MG/2ML IJ SOLN
4.0000 mg | Freq: Once | INTRAMUSCULAR | Status: AC
  Administered 2020-01-17: 4 mg via INTRAVENOUS
  Filled 2020-01-17: qty 2

## 2020-01-17 MED ORDER — SODIUM CHLORIDE 0.9 % IV BOLUS: 1000.0000 mL | Freq: Once | INTRAVENOUS | Status: DC

## 2020-01-17 MED ORDER — ONDANSETRON 4 MG PO TBDP
4.0000 mg | ORAL_TABLET | Freq: Once | ORAL | Status: AC | PRN
  Administered 2020-01-17: 4 mg via ORAL
  Filled 2020-01-17: qty 1

## 2020-01-17 MED ORDER — MORPHINE SULFATE (PF) 2 MG/ML IV SOLN
2.0000 mg | Freq: Once | INTRAVENOUS | Status: AC
  Administered 2020-01-17: 2 mg via INTRAVENOUS
  Filled 2020-01-17: qty 1

## 2020-01-17 NOTE — ED Triage Notes (Addendum)
Pt presents to ED with frequent vomiting for the past 2 hours. Pt reports burning in his esophagus from vomiting. Ate seafood buffet prior to onset of symptoms. Pt had an episode of vomiting during triage.

## 2020-01-18 ENCOUNTER — Encounter: Payer: Self-pay | Admitting: Radiology

## 2020-01-18 ENCOUNTER — Emergency Department: Payer: BC Managed Care – PPO

## 2020-01-18 MED ORDER — IOHEXOL 300 MG/ML  SOLN
100.0000 mL | Freq: Once | INTRAMUSCULAR | Status: AC | PRN
  Administered 2020-01-18: 01:00:00 100 mL via INTRAVENOUS

## 2020-01-18 MED ORDER — PROMETHAZINE HCL 25 MG/ML IJ SOLN
12.5000 mg | Freq: Once | INTRAMUSCULAR | Status: AC
  Administered 2020-01-18: 12.5 mg via INTRAVENOUS

## 2020-01-18 MED ORDER — METOCLOPRAMIDE HCL 5 MG/ML IJ SOLN
10.0000 mg | Freq: Once | INTRAMUSCULAR | Status: AC
  Administered 2020-01-18: 01:00:00 10 mg via INTRAVENOUS
  Filled 2020-01-18: qty 2

## 2020-01-18 MED ORDER — ONDANSETRON 4 MG PO TBDP: 4.0000 mg | ORAL_TABLET | Freq: Three times a day (TID) | ORAL | 0 refills | Status: DC | PRN

## 2020-01-18 MED ORDER — DICYCLOMINE HCL 10 MG PO CAPS
10.0000 mg | ORAL_CAPSULE | Freq: Once | ORAL | Status: AC
  Administered 2020-01-18: 10 mg via ORAL
  Filled 2020-01-18: qty 1

## 2020-01-18 NOTE — ED Provider Notes (Signed)
[facility] Emergency Department Provider Note  ____________________________________________   First MD Initiated Contact with Patient 01/17/20 2347     (approximate)  I have reviewed the triage vital signs and the nursing notes.   HISTORY  Chief Complaint Emesis   HPI EDWORD CU is a 24 y.o. male with below list of previous medical conditions presents to the emergency department secondary to acute onset of nonbloody vomiting and diarrhea which began 2 hours before presentation.  Patient states symptoms began after eating at a seafood buffet.  Patient does admit to abdominal pain that is generalized in nature.  Patient denies any fever.  Patient actively vomiting on my arrival to the room.    Past Medical History:  Diagnosis Date  . Sickle cell trait [facility])     Patient Active Problem List   Diagnosis Date Noted  . Sickle cell trait (H[facility]) 01/08/2018  . G6PD deficiency 08/07/2017    Past Surgical History:  Procedure Laterality Date  . KNEE SURGERY Left 08/2015    Prior to Admission medications   Medication Sig Start Date End Date Taking? Authorizing Provider  Efinaconazole 10 % SOLN Apply 1 application topically daily. Apply 1 drop to the end of the fingernail, use applicator to spread medication over the fingernail.  Apply for 48 weeks. 02/23/19 01/25/20  Cuthriell, Delorise Royals, PA-C  mupirocin ointment (BACTROBAN) 2 % Apply two times a day for 7 days. 12/30/18   Renford Dills, NP  ondansetron (ZOFRAN ODT) 4 MG disintegrating tablet Take 1 tablet (4 mg total) by mouth every 8 (eight) hours as needed. 01/18/20   Darci Current, MD    Allergies Blueberry flavor and Sulfa antibiotics  Family History  Problem Relation Age of Onset  . Stroke Other   . Heart failure Other   . Diabetes Other     Social History Social History   Tobacco Use  . Smoking status: Never Smoker  . Smokeless tobacco: Never Used  Substance Use Topics  .  Alcohol use: Yes    Comment: social  . Drug use: Yes    Types: Marijuana    Comment: last use today    Review of Systems Constitutional: No fever/chills Eyes: No visual changes. ENT: No sore throat. Cardiovascular: Denies chest pain. Respiratory: Denies shortness of breath. Gastrointestinal: Positive for abdominal pain nausea vomiting and diarrhea Genitourinary: Negative for dysuria. Musculoskeletal: Negative for neck pain.  Negative for back pain. Integumentary: Negative for rash. Neurological: Negative for headaches, focal weakness or numbness.  ____________________________________________   PHYSICAL EXAM:  VITAL SIGNS: ED Triage Vitals  Enc Vitals Group     BP 01/17/20 2146 110/62     Pulse Rate 01/17/20 2146 67     Resp 01/17/20 2146 18     Temp 01/17/20 2146 98.2 F (36.8 C)     Temp Source 01/17/20 2146 Oral     SpO2 01/17/20 2146 100 %     Weight 01/17/20 2155 79.4 kg (175 lb)     Height 01/17/20 2155 1.727 m (5\' 8" )     Head Circumference --      Peak Flow --      Pain Score 01/17/20 2154 10     Pain Loc --      Pain Edu? --      Excl. in [place]? --     Constitutional: Alert and oriented.  Eyes: Conjunctivae are normal.  Mouth/Throat: Patient is wearing a mask. Neck: No stridor.  No meningeal  signs.   Cardiovascular: Normal rate, regular rhythm. Good peripheral circulation. Grossly normal heart sounds. Respiratory: Normal respiratory effort.  No retractions. Gastrointestinal: Generalized tenderness to palpation.  No distention.  Musculoskeletal: No lower extremity tenderness nor edema. No gross deformities of extremities. Neurologic:  Normal speech and language. No gross focal neurologic deficits are appreciated.  Skin:  Skin is warm, dry and intact. Psychiatric: Mood and affect are normal. Speech and behavior are normal.  ____________________________________________   LABS (all labs ordered are listed, but only abnormal results are displayed)  Labs  Reviewed  COMPREHENSIVE METABOLIC PANEL - Abnormal; Notable for the following components:      Result Value   Glucose, Bld 135 (*)    All other components within normal limits  CBC - Abnormal; Notable for the following components:   WBC 12.4 (*)    All other components within normal limits  LIPASE, BLOOD  URINALYSIS, COMPLETE (UACMP) WITH MICROSCOPIC   _________________________________  RADIOLOGY I, [place] Keriana Sarsfield, personally viewed and evaluated these images (plain radiographs) as part of my medical decision making, as well as reviewing the written report by the radiologist.  ED MD interpretation: No acute intra-abdominal or pelvic pathology noted on CT normal appendix per radiologist  Official radiology report(s): CT ABDOMEN PELVIS W CONTRAST  Result Date: 01/18/2020 CLINICAL DATA:  Frequent vomiting for 2 hours EXAM: CT ABDOMEN AND PELVIS WITH CONTRAST TECHNIQUE: Multidetector CT imaging of the abdomen and pelvis was performed using the standard protocol following bolus administration of intravenous contrast. CONTRAST:  OMNIPAQUE IOHEXOL 300 MG/ML  SOLN COMPARISON:  08/07/2017 FINDINGS: Lower chest: No acute pleural or parenchymal lung disease. Hepatobiliary: No focal liver abnormality is seen. No gallstones, gallbladder wall thickening, or biliary dilatation. Pancreas: Unremarkable. No pancreatic ductal dilatation or surrounding inflammatory changes. Spleen: Normal in size without focal abnormality. Adrenals/Urinary Tract: Adrenal glands are unremarkable. Kidneys are normal, without renal calculi, focal lesion, or hydronephrosis. Bladder is unremarkable. Stomach/Bowel: No bowel obstruction or ileus. There is a normal gas-filled appendix in the right lower quadrant. No inflammatory changes. Vascular/Lymphatic: No significant vascular findings are present. No enlarged abdominal or pelvic lymph nodes. Reproductive: Prostate is unremarkable. Other: No abdominal wall hernia or  abnormality. No abdominopelvic ascites. Musculoskeletal: No acute or destructive bony lesions. Reconstructed images demonstrate no additional findings. IMPRESSION: 1. No acute intra-abdominal or intrapelvic process. Normal appendix. Electronically Signed   By: Sharlet Salina M.D.   On: 01/18/2020 01:41    ______  Procedures   ____________________________________________   INITIAL IMPRESSION / MDM / ASSESSMENT AND PLAN / ED COURSE  As part of my medical decision making, I reviewed the following data within the electronic MEDICAL RECORD NUMBER   24 year old male presented with above-stated history and physical exam differential diagnosis including but not limited to gastroenteritis, appendicitis colitis.  Patient given IV Zofran with continued nausea and vomiting and subsequently given Reglan and Phenergan with resolution of vomiting.  Patient given 2 L IV normal saline.  Patient symptoms resolved at this time.  CT revealed no intra-abdominal pathology.  ____________________________________________  FINAL CLINICAL IMPRESSION(S) / ED DIAGNOSES  Final diagnoses:  Gastroenteritis     MEDICATIONS GIVEN DURING THIS VISIT:  Medications  sodium chloride flush (NS) 0.9 % injection 3 mL (has no administration in time range)  ondansetron (ZOFRAN-ODT) disintegrating tablet 4 mg (4 mg Oral Given 01/17/20 2200)  ondansetron (ZOFRAN) injection 4 mg (4 mg Intravenous Given 01/17/20 2358)  morphine 2 MG/ML injection 2 mg (2 mg Intravenous Given  01/17/20 2357)  sodium chloride 0.9 % bolus 1,000 mL (1,000 mLs Intravenous New Bag/Given 01/17/20 2359)  sodium chloride 0.9 % bolus 1,000 mL (1,000 mLs Intravenous New Bag/Given 01/17/20 2359)  metoCLOPramide (REGLAN) injection 10 mg (10 mg Intravenous Given 01/18/20 0037)  promethazine (PHENERGAN) injection 12.5 mg (12.5 mg Intravenous Given 01/18/20 0105)  iohexol (OMNIPAQUE) 300 MG/ML solution 100 mL (100 mLs Intravenous Contrast Given 01/18/20 0127)  dicyclomine  (BENTYL) capsule 10 mg (10 mg Oral Given 01/18/20 0216)     ED Discharge Orders         Ordered    ondansetron (ZOFRAN ODT) 4 MG disintegrating tablet  Every 8 hours PRN     01/18/20 0219          *Please note:  YAASIR MENKEN was evaluated in Emergency Department on 01/18/2020 for the symptoms described in the history of present illness. He was evaluated in the context of the global COVID-19 pandemic, which necessitated consideration that the patient might be at risk for infection with the SARS-CoV-2 virus that causes COVID-19. Institutional protocols and algorithms that pertain to the evaluation of patients at risk for COVID-19 are in a state of rapid change based on information released by regulatory bodies including the CDC and federal and state organizations. These policies and algorithms were followed during the patient's care in the ED.  Some ED evaluations and interventions may be delayed as a result of limited staffing during the pandemic.*  Note:  This document was prepared using Dragon voice recognition software and may include unintentional dictation errors.   Gregor Hams, MD 01/18/20 Natasha Mead

## 2020-12-04 ENCOUNTER — Other Ambulatory Visit: Payer: Self-pay

## 2020-12-04 ENCOUNTER — Ambulatory Visit
Admission: EM | Admit: 2020-12-04 | Discharge: 2020-12-04 | Disposition: A | Discharge status: Home / Self Care | Payer: BC Managed Care – PPO | Attending: Family Medicine | Admitting: Family Medicine

## 2020-12-04 ENCOUNTER — Encounter: Payer: Self-pay | Admitting: Emergency Medicine

## 2020-12-04 DIAGNOSIS — J988 Other specified respiratory disorders: Secondary | ICD-10-CM | POA: Diagnosis not present

## 2020-12-04 DIAGNOSIS — Z20822 Contact with and (suspected) exposure to covid-19: Secondary | ICD-10-CM | POA: Diagnosis present

## 2020-12-04 DIAGNOSIS — B9789 Other viral agents as the cause of diseases classified elsewhere: Secondary | ICD-10-CM | POA: Diagnosis present

## 2020-12-04 NOTE — ED Triage Notes (Signed)
Patient c/o chills, cough, nasal congestion that started 3 days ago.

## 2020-12-04 NOTE — ED Provider Notes (Signed)
[facility]    CSN: [id number] Arrival date & time: 12/04/20  1421  History   Chief Complaint Chief Complaint  Patient presents with  . Cough  . Nasal Congestion  . Chills   HPI   25 year old male presents with respiratory symptoms.  Patient informs me that he has had symptoms for 5 days as opposed to 3 days which he reported to the nursing staff.  He states that he has had chills, fatigue, sore throat, cough.  No documented fever.  He was recently exposed to someone with COVID-19 over the New Year's holiday.  Patient is concerned that he has COVID-19.  He desires testing today.  He needs this regarding his work.  No other complaints at this time.  Past Medical History:  Diagnosis Date  . Sickle cell trait [facility])    Patient Active Problem List   Diagnosis Date Noted  . Sickle cell trait (HCC) 01/08/2018  . G6PD deficiency 08/07/2017   Past Surgical History:  Procedure Laterality Date  . KNEE SURGERY Left 08/2015   Home Medications    Prior to Admission medications   Not on File    Family History Family History  Problem Relation Age of Onset  . Stroke Other   . Heart failure Other   . Diabetes Other     Social History Social History   Tobacco Use  . Smoking status: Never Smoker  . Smokeless tobacco: Never Used  Vaping Use  . Vaping Use: Never used  Substance Use Topics  . Alcohol use: Yes    Comment: social  . Drug use: Yes    Types: Marijuana    Comment: last use today     Allergies   Blueberry flavor and Sulfa antibiotics   Review of Systems Review of Systems Per HPI  Physical Exam Triage Vital Signs ED Triage Vitals  Enc Vitals Group     BP 12/04/20 1609 117/72     Pulse Rate 12/04/20 1609 73     Resp 12/04/20 1609 18     Temp 12/04/20 1609 98.3 F (36.8 C)     Temp Source 12/04/20 1609 Oral     SpO2 12/04/20 1609 100 %     Weight 12/04/20 1607 175 lb (79.4 kg)     Height 12/04/20 1607 5\' 8"  (1.727 m)     Head  Circumference --      Peak Flow --      Pain Score 12/04/20 1607 0     Pain Loc --      Pain Edu? --      Excl. in [place]? --    Updated Vital Signs BP 117/72 (BP Location: Right Arm)   Pulse 73   Temp 98.3 F (36.8 C) (Oral)   Resp 18   Ht 5\' 8"  (1.727 m)   Wt 79.4 kg   SpO2 100%   BMI 26.61 kg/m   Visual Acuity Right Eye Distance:   Left Eye Distance:   Bilateral Distance:    Right Eye Near:   Left Eye Near:    Bilateral Near:     Physical Exam Vitals and nursing note reviewed.  Constitutional:      General: He is not in acute distress.    Appearance: Normal appearance. He is not ill-appearing.  HENT:     Head: Normocephalic and atraumatic.  Eyes:     General:        Right eye: No discharge.  Left eye: No discharge.     Conjunctiva/sclera: Conjunctivae normal.  Cardiovascular:     Rate and Rhythm: Normal rate and regular rhythm.     Heart sounds: No murmur heard.   Pulmonary:     Effort: Pulmonary effort is normal.     Breath sounds: Normal breath sounds. No wheezing, rhonchi or rales.  Neurological:     Mental Status: He is alert.  Psychiatric:        Mood and Affect: Mood normal.        Behavior: Behavior normal.    UC Treatments / Results  Labs (all labs ordered are listed, but only abnormal results are displayed) Labs Reviewed  SARS CORONAVIRUS 2 (TAT 6-24 HRS)    EKG   Radiology No results found.  Procedures Procedures (including critical care time)  Medications Ordered in UC Medications - No data to display  Initial Impression / Assessment and Plan / UC Course  I have reviewed the triage vital signs and the nursing notes.  Pertinent labs & imaging results that were available during my care of the patient were reviewed by me and considered in my medical decision making (see chart for details).    25 year old male presents with a viral respiratory infection.  Recent exposure COVID-19.  Awaiting test results.  Patient reports 5  days of symptoms.  He is out of the quarantine window as he is afebrile and well-appearing.  Work note given.  Awaiting test results.  Supportive care.  Final Clinical Impressions(s) / UC Diagnoses   Final diagnoses:  Viral respiratory infection  Exposure to COVID-19 virus     Discharge Instructions     Check my chart for COVID test results.  Take care  Dr. Adriana Simas     ED Prescriptions    None     PDMP not reviewed this encounter.   Tommie Sams, [place] 12/04/20 1721

## 2020-12-04 NOTE — Discharge Instructions (Addendum)
Check my chart for COVID test results.  Take care  Dr. Adriana Simas

## 2020-12-05 LAB — SARS CORONAVIRUS 2 (TAT 6-24 HRS): SARS Coronavirus 2: NEGATIVE

## 2021-02-03 ENCOUNTER — Other Ambulatory Visit: Payer: Self-pay

## 2021-02-03 ENCOUNTER — Ambulatory Visit
Admission: EM | Admit: 2021-02-03 | Discharge: 2021-02-03 | Disposition: A | Discharge status: Home / Self Care | Payer: BC Managed Care – PPO | Attending: Sports Medicine | Admitting: Sports Medicine

## 2021-02-03 ENCOUNTER — Encounter: Payer: Self-pay | Admitting: Gynecology

## 2021-02-03 DIAGNOSIS — R369 Urethral discharge, unspecified: Secondary | ICD-10-CM | POA: Diagnosis not present

## 2021-02-03 DIAGNOSIS — Z9189 Other specified personal risk factors, not elsewhere classified: Secondary | ICD-10-CM | POA: Diagnosis not present

## 2021-02-03 DIAGNOSIS — N390 Urinary tract infection, site not specified: Secondary | ICD-10-CM | POA: Diagnosis present

## 2021-02-03 DIAGNOSIS — R300 Dysuria: Secondary | ICD-10-CM | POA: Diagnosis present

## 2021-02-03 DIAGNOSIS — R110 Nausea: Secondary | ICD-10-CM | POA: Insufficient documentation

## 2021-02-03 DIAGNOSIS — Z202 Contact with and (suspected) exposure to infections with a predominantly sexual mode of transmission: Secondary | ICD-10-CM | POA: Insufficient documentation

## 2021-02-03 LAB — URINALYSIS, COMPLETE (UACMP) WITH MICROSCOPIC
Bilirubin Urine: NEGATIVE
Glucose, UA: NEGATIVE mg/dL
Ketones, ur: NEGATIVE mg/dL
Nitrite: NEGATIVE
Protein, ur: NEGATIVE mg/dL
Specific Gravity, Urine: 1.02 (ref 1.005–1.030)
Squamous Epithelial / HPF: NONE SEEN (ref 0–5)
pH: 7 (ref 5.0–8.0)

## 2021-02-03 LAB — CHLAMYDIA/NGC RT PCR (ARMC ONLY)
Chlamydia Tr: NOT DETECTED
N gonorrhoeae: DETECTED — AB

## 2021-02-03 MED ORDER — CEPHALEXIN 500 MG PO CAPS: 500.0000 mg | ORAL_CAPSULE | Freq: Two times a day (BID) | ORAL | 0 refills | Status: DC

## 2021-02-03 NOTE — ED Provider Notes (Signed)
[facility]    CSN: [id number] Arrival date & time: 02/03/21  1322      History   Chief Complaint Chief Complaint  Patient presents with  . SEXUALLY TRANSMITTED DISEASE    HPI Harry Singleton is a 25 y.o. male.   Pleasant 25 year old male who presents for evaluation of the above issues.  He works as a Financial risk analyst.  He is noted a whitish discharge from his penis for about 2 days.  It is going onto his underwear and track pants.  He had recent unprotected sex.  He denies any fever shakes chills.  Some nausea but no vomiting or diarrhea.  No chest pain or shortness of breath.  No Covid-like symptoms.  He notes a little bit of pain at the penis but there is no lesions.  He denies flank pain or hematuria but he does report dysuria.  No significant abdominal pain.  He does have a history of chlamydia 3 years ago.  I will have access to those records.  He reports that his partner was recently seen in the clinic and he tells me that she was tested and nothing came back positive.  He wants to be tested today for STDs as well as a urine infection.  No red flag signs or symptoms elicited on history.     Past Medical History:  Diagnosis Date  . Sickle cell trait [facility])     Patient Active Problem List   Diagnosis Date Noted  . Sickle cell trait (HCC) 01/08/2018  . G6PD deficiency 08/07/2017    Past Surgical History:  Procedure Laterality Date  . KNEE SURGERY Left 08/2015       Home Medications    Prior to Admission medications   Medication Sig Start Date End Date Taking? Authorizing Provider  cephALEXin (KEFLEX) 500 MG capsule Take 1 capsule (500 mg total) by mouth 2 (two) times daily. 02/03/21  Yes Delton See, MD  Clobetasol Propionate 0.05 % shampoo Apply 1 application topically once a week. 10/26/20  Yes [provider]  Fluocinolone Acetonide Scalp 0.01 % OIL Apply 1 application topically at bedtime. 10/26/20  Yes [provider]    Family  History Family History  Problem Relation Age of Onset  . Stroke Other   . Heart failure Other   . Diabetes Other     Social History Social History   Tobacco Use  . Smoking status: Never Smoker  . Smokeless tobacco: Never Used  Vaping Use  . Vaping Use: Never used  Substance Use Topics  . Alcohol use: Yes    Comment: social  . Drug use: Yes    Types: Marijuana    Comment: last use today     Allergies   Blueberry flavor and Sulfa antibiotics   Review of Systems Review of Systems  Constitutional: Negative for activity change, appetite change, chills, diaphoresis and fever.  HENT: Negative.   Respiratory: Negative.   Cardiovascular: Negative.   Gastrointestinal: Positive for nausea. Negative for abdominal pain, constipation, diarrhea and vomiting.  Genitourinary: Positive for dysuria, penile discharge and penile pain. Negative for flank pain, frequency, hematuria, penile swelling, scrotal swelling and testicular pain.  Musculoskeletal: Negative.   Skin: Negative for color change, rash and wound.  Neurological: Negative for dizziness, syncope, light-headedness, numbness and headaches.  All other systems reviewed and are negative.    Physical Exam Triage Vital Signs ED Triage Vitals  Enc Vitals Group     BP 02/03/21 1332 127/73  Pulse Rate 02/03/21 1332 79     Resp --      Temp 02/03/21 1332 98.5 F (36.9 C)     Temp Source 02/03/21 1332 Oral     SpO2 02/03/21 1332 99 %     Weight 02/03/21 1333 175 lb (79.4 kg)     Height 02/03/21 1333 5\' 9"  (1.753 m)     Head Circumference --      Peak Flow --      Pain Score 02/03/21 1332 9     Pain Loc --      Pain Edu? --      Excl. in GC? --    No data found.  Updated Vital Signs BP 127/73 (BP Location: Right Arm)   Pulse 79   Temp 98.5 F (36.9 C) (Oral)   Ht 5\' 9"  (1.753 m)   Wt 79.4 kg   SpO2 99%   BMI 25.84 kg/m   Visual Acuity Right Eye Distance:   Left Eye Distance:   Bilateral Distance:     Right Eye Near:   Left Eye Near:    Bilateral Near:     Physical Exam Vitals and nursing note reviewed.  Constitutional:      General: He is not in acute distress.    Appearance: He is not ill-appearing, toxic-appearing or diaphoretic.  HENT:     Head: Normocephalic and atraumatic.  Eyes:     Extraocular Movements: Extraocular movements intact.     Pupils: Pupils are equal, round, and reactive to light.  Cardiovascular:     Rate and Rhythm: Normal rate and regular rhythm.     Pulses: Normal pulses.     Heart sounds: Normal heart sounds. No murmur heard. No friction rub. No gallop.   Pulmonary:     Effort: Pulmonary effort is normal. No respiratory distress.     Breath sounds: No stridor. No wheezing, rhonchi or rales.  Abdominal:     General: Bowel sounds are normal.     Tenderness: There is abdominal tenderness. There is no right CVA tenderness, left CVA tenderness, guarding or rebound.  Genitourinary:    Pubic Area: No rash.      Penis: Circumcised.      Testes: Normal.     Tanner stage (genital): 5.     Comments: Circumcised penis.  Patient does have some discharge in his underwear.  There is no erythema.  No evidence of a hernia.  No lesions on the penis.  No tenderness over the testicles bilaterally. Musculoskeletal:     Cervical back: Normal range of motion.  Skin:    General: Skin is warm and dry.     Capillary Refill: Capillary refill takes less than 2 seconds.     Findings: No erythema or rash.  Neurological:     Mental Status: He is alert.      UC Treatments / Results  Labs (all labs ordered are listed, but only abnormal results are displayed) Labs Reviewed  URINALYSIS, COMPLETE (UACMP) WITH MICROSCOPIC - Abnormal; Notable for the following components:      Result Value   APPearance HAZY (*)    Hgb urine dipstick TRACE (*)    Leukocytes,Ua SMALL (*)    Bacteria, UA FEW (*)    All other components within normal limits  CHLAMYDIA/NGC RT PCR ([facility]  ONLY)  URINE CULTURE    EKG   Radiology No results found.  Procedures Procedures (including critical care time)  Medications Ordered in UC Medications -  No data to display  Initial Impression / Assessment and Plan / UC Course  I have reviewed the triage vital signs and the nursing notes.  Pertinent labs & imaging results that were available during my care of the patient were reviewed by me and considered in my medical decision making (see chart for details).   Clinical impression: 2 days of discharge from the penis with exposure to possible STD.  Patient has dysuria and some nausea.  Treatment plan: 1.  The findings and treatment plan were discussed in detail with the patient.  Patient was in agreement. 2.  Recommended getting a UA.  The results are above.  Does show a small amount of leukocytes and a few bacteria.  We will treat accordingly.  Send off the culture.  Indicated to him that he may get a call to stop the antibiotics.  He also may get a call that the antibiotics need to be switched to something else.  Prescribed Keflex twice daily for 7 days. 3.  Educational handout was provided. 4.  Also got chlamydia and gonorrhea cultures.  They were sent to the hospital.  Someone will contact him with his results once they are available. 5.  He initially wanted to do further STD testing.  Ordered HIV and syphilis.  When the nurse went in to draw blood he declined on that he wants to get it from his primary care provider. 6.  Went over the red flag signs and symptoms.  If he develops any of them he knows he needs to go to the ER for higher level of care. 7.  He did not need a work note. 8.  Follow-up here as needed.    Final Clinical Impressions(s) / UC Diagnoses   Final diagnoses:  Penile discharge  Possible exposure to STD  Dysuria  At risk for sexually transmitted disease due to unprotected sex  Nausea without vomiting  Acute lower urinary tract infection      Discharge Instructions     Your urinalysis showed a small amount of leukocytes and a few bacteria.  I am going to treat you for urinary tract infection.  I am also sending off the culture.  If someone calls you they may change the antibiotic if the one I chose is resistant to anything that grows on the culture.  If your culture does not grow anything they may call you and tell you to stop the antibiotic. I have also provided you with educational handouts. Your chlamydia and gonorrhea have been sent to the hospital. I did order HIV and syphilis but you declined and you will see your primary care physician if you want to pursue that further. If you develop any significant symptoms that requires a higher level of care, please go to the emergency room.  Otherwise follow-up with your primary care physician.  I hope you get to feeling better, Dr. Zachery Dauer    ED Prescriptions    Medication Sig Dispense Auth. Provider   cephALEXin (KEFLEX) 500 MG capsule Take 1 capsule (500 mg total) by mouth 2 (two) times daily. 14 capsule Delton See, MD     PDMP not reviewed this encounter.   Delton See, MD 02/03/21 [phone]

## 2021-02-03 NOTE — Discharge Instructions (Addendum)
Your urinalysis showed a small amount of leukocytes and a few bacteria.  I am going to treat you for urinary tract infection.  I am also sending off the culture.  If someone calls you they may change the antibiotic if the one I chose is resistant to anything that grows on the culture.  If your culture does not grow anything they may call you and tell you to stop the antibiotic. I have also provided you with educational handouts. Your chlamydia and gonorrhea have been sent to the hospital. I did order HIV and syphilis but you declined and you will see your primary care physician if you want to pursue that further. If you develop any significant symptoms that requires a higher level of care, please go to the emergency room.  Otherwise follow-up with your primary care physician.  I hope you get to feeling better, Dr. Zachery Dauer

## 2021-02-03 NOTE — ED Triage Notes (Signed)
Patient c/o whitish discharge from penis x couple days. Per pt. Painful urination.

## 2021-02-04 LAB — URINE CULTURE: Culture: NO GROWTH

## 2021-02-05 ENCOUNTER — Ambulatory Visit
Admission: EM | Admit: 2021-02-05 | Discharge: 2021-02-05 | Disposition: A | Discharge status: Home / Self Care | Payer: BC Managed Care – PPO | Attending: Sports Medicine | Admitting: Sports Medicine

## 2021-02-05 ENCOUNTER — Other Ambulatory Visit: Payer: Self-pay

## 2021-02-05 DIAGNOSIS — R369 Urethral discharge, unspecified: Secondary | ICD-10-CM

## 2021-02-05 DIAGNOSIS — A549 Gonococcal infection, unspecified: Secondary | ICD-10-CM

## 2021-02-05 MED ORDER — CEFTRIAXONE SODIUM 500 MG IJ SOLR
500.0000 mg | Freq: Once | INTRAMUSCULAR | Status: AC
  Administered 2021-02-05: 500 mg via INTRAMUSCULAR

## 2021-02-05 NOTE — Discharge Instructions (Addendum)
You can stop the antibiotic.  Injection should cover any infection that you have.  Please practice safe sex.

## 2021-02-05 NOTE — ED Provider Notes (Signed)
[facility]    CSN: [id number] Arrival date & time: 02/05/21  1038      History   Chief Complaint Chief Complaint  Patient presents with  . Nurse Visit     Rocephin Inj     HPI Harry Singleton is a 25 y.o. male.   Patient was seen 2 days ago.  Put on antibiotics for a presumed UTI.  Culture did not grow anything.  His culture for gonorrhea came back positive.  He reports that someone called him and told him to come in for shot.  He reports that his discharge has improved since put going on the antibiotics.  Rocephin given in the office by nursing staff.  Advised to stop the antibiotic as his urine culture was negative.  Follow-up as needed.     Past Medical History:  Diagnosis Date  . Sickle cell trait [facility])     Patient Active Problem List   Diagnosis Date Noted  . Sickle cell trait (HCC) 01/08/2018  . G6PD deficiency 08/07/2017    Past Surgical History:  Procedure Laterality Date  . KNEE SURGERY Left 08/2015       Home Medications    Prior to Admission medications   Medication Sig Start Date End Date Taking? Authorizing Provider  cephALEXin (KEFLEX) 500 MG capsule Take 1 capsule (500 mg total) by mouth 2 (two) times daily. 02/03/21   Delton See, MD  Clobetasol Propionate 0.05 % shampoo Apply 1 application topically once a week. 10/26/20   [provider]  Fluocinolone Acetonide Scalp 0.01 % OIL Apply 1 application topically at bedtime. 10/26/20   [provider]    Family History Family History  Problem Relation Age of Onset  . Stroke Other   . Heart failure Other   . Diabetes Other     Social History Social History   Tobacco Use  . Smoking status: Never Smoker  . Smokeless tobacco: Never Used  Vaping Use  . Vaping Use: Never used  Substance Use Topics  . Alcohol use: Yes    Comment: social  . Drug use: Yes    Types: Marijuana    Comment: last use today     Allergies   Blueberry flavor and Sulfa  antibiotics   Review of Systems Review of Systems   Physical Exam Triage Vital Signs ED Triage Vitals [02/05/21 1202]  Enc Vitals Group     BP 113/75     Pulse Rate 74     Resp 18     Temp 98.3 F (36.8 C)     Temp Source Oral     SpO2 98 %     Weight      Height      Head Circumference      Peak Flow      Pain Score      Pain Loc      Pain Edu?      Excl. in GC?    No data found.  Updated Vital Signs BP 113/75 (BP Location: Left Arm)   Pulse 74   Temp 98.3 F (36.8 C) (Oral)   Resp 18   SpO2 98%   Visual Acuity Right Eye Distance:   Left Eye Distance:   Bilateral Distance:    Right Eye Near:   Left Eye Near:    Bilateral Near:     Physical Exam   UC Treatments / Results  Labs (all labs ordered are listed, but only abnormal results are  displayed) Labs Reviewed - No data to display  EKG   Radiology No results found.  Procedures Procedures (including critical care time)  Medications Ordered in UC Medications  cefTRIAXone (ROCEPHIN) injection 500 mg (500 mg Intramuscular Given 02/05/21 1219)    Initial Impression / Assessment and Plan / UC Course  I have reviewed the triage vital signs and the nursing notes.  Pertinent labs & imaging results that were available during my care of the patient were reviewed by me and considered in my medical decision making (see chart for details).     Final Clinical Impressions(s) / UC Diagnoses   Final diagnoses:  Gonorrhea  Penile discharge     Discharge Instructions     You can stop the antibiotic.  Injection should cover any infection that you have.  Please practice safe sex.    ED Prescriptions    None     PDMP not reviewed this encounter.   Delton See, MD 02/05/21 1229

## 2021-03-22 ENCOUNTER — Other Ambulatory Visit: Payer: Self-pay

## 2021-03-22 ENCOUNTER — Ambulatory Visit
Admission: EM | Admit: 2021-03-22 | Discharge: 2021-03-22 | Disposition: A | Discharge status: Home / Self Care | Payer: BC Managed Care – PPO | Attending: Emergency Medicine | Admitting: Emergency Medicine

## 2021-03-22 DIAGNOSIS — L89891 Pressure ulcer of other site, stage 1: Secondary | ICD-10-CM | POA: Diagnosis not present

## 2021-03-22 DIAGNOSIS — M5431 Sciatica, right side: Secondary | ICD-10-CM | POA: Diagnosis not present

## 2021-03-22 DIAGNOSIS — L739 Follicular disorder, unspecified: Secondary | ICD-10-CM | POA: Diagnosis not present

## 2021-03-22 MED ORDER — MUPIROCIN CALCIUM 2 % NA OINT: TOPICAL_OINTMENT | NASAL | 0 refills | Status: DC

## 2021-03-22 MED ORDER — PREDNISONE 10 MG (21) PO TBPK: ORAL_TABLET | ORAL | 0 refills | Status: DC

## 2021-03-22 NOTE — Discharge Instructions (Addendum)
Take the prednisone pack as directed by the package starting this morning for your sciatica.  Apply the mupirocin ointment to the red area on the outside of your right ankle 3 times a day for 7 days.  Wear better fitting shoes and padded socks to avoid any further pressure injury to the joint of your big toe.  If your feet get wet you need to change your socks and dry your feet off to prevent skin breakdown and possible fungal infection.

## 2021-03-22 NOTE — ED Triage Notes (Signed)
Patient has pain in his whole left leg and foot. States that he has a swollen area on bottom right foot. Reports that he is unsure if something bit him or poked him in the initial area. States that pain has spread up his entire leg. Reports that he wears crocs at his job routinely.

## 2021-03-22 NOTE — ED Provider Notes (Signed)
[facility]    CSN: [id number] Arrival date & time: 03/22/21  0913      History   Chief Complaint Chief Complaint  Patient presents with  . Leg Pain    HPI MARIA GALLICCHIO is a 25 y.o. male.   HPI   25 year old male here for evaluation of right leg pain and right foot pain.  Patient reports that he has been having pain for last 1 to 2 days and he is concerned about some dry skin on the inside of his right sole, some redness at his right big toe joint, a cluster of bumps on his right ankle, and pain that extends from his buttock down the outside of his right leg.  Patient denies fever, numbness or tingling, or redness.  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  Past Medical History:  Diagnosis Date  . Sickle cell trait [facility])     Patient Active Problem List   Diagnosis Date Noted  . Sickle cell trait (HCC) 01/08/2018  . G6PD deficiency 08/07/2017    Past Surgical History:  Procedure Laterality Date  . KNEE SURGERY Left 08/2015       Home Medications    Prior to Admission medications   Medication Sig Start Date End Date Taking? Authorizing Provider  Clobetasol Propionate 0.05 % shampoo Apply 1 application topically once a week. 10/26/20  Yes [provider]  Fluocinolone Acetonide Scalp 0.01 % OIL Apply 1 application topically at bedtime. 10/26/20  Yes [provider]  mupirocin nasal ointment (BACTROBAN) 2 % Apply to wound 3 times a day. 03/22/21  Yes Becky Augusta, NP  predniSONE (STERAPRED UNI-PAK 21 TAB) 10 MG (21) TBPK tablet Take 6 tablets on day 1, 5 tablets day 2, 4 tablets day 3, 3 tablets day 4, 2 tablets day 5, 1 tablet day 6 03/22/21  Yes Becky Augusta, NP    Family History Family History  Problem Relation Age of Onset  . Stroke Other   . Heart failure Other   .  Diabetes Other     Social History Social History   Tobacco Use  . Smoking status: Never Smoker  . Smokeless tobacco: Never Used  Vaping Use  . Vaping Use: Never used  Substance Use Topics  . Alcohol use: Yes    Comment: social  . Drug use: Yes    Types: Marijuana    Comment: last use today     Allergies   Blueberry flavor and Sulfa antibiotics   Review of Systems Review of Systems  Constitutional: Negative for activity change, appetite change and fever.  Musculoskeletal: Positive for arthralgias and myalgias.  Skin: Positive for color change and rash.  Neurological: Negative for weakness and numbness.  Hematological: Negative.   Psychiatric/Behavioral: Negative.      Physical Exam Triage Vital Signs ED Triage Vitals  Enc Vitals Group     BP 03/22/21 0946 130/75     Pulse Rate 03/22/21 0946 88     Resp 03/22/21 0946 18     Temp 03/22/21 0946 98.7 F (37.1 C)     Temp Source 03/22/21 0946 Oral     SpO2 03/22/21 0946 99 %     Weight 03/22/21 0945 170 lb (77.1 kg)     Height 03/22/21 0945 5\' 8"  (1.727 m)  Head Circumference --      Peak Flow --      Pain Score 03/22/21 0944 10     Pain Loc --      Pain Edu? --      Excl. in GC? --    No data found.  Updated Vital Signs BP 130/75 (BP Location: Left Arm)   Pulse 88   Temp 98.7 F (37.1 C) (Oral)   Resp 18   Ht 5\' 8"  (1.727 m)   Wt 170 lb (77.1 kg)   SpO2 99%   BMI 25.85 kg/m   Visual Acuity Right Eye Distance:   Left Eye Distance:   Bilateral Distance:    Right Eye Near:   Left Eye Near:    Bilateral Near:     Physical Exam Vitals and nursing note reviewed.  Constitutional:      General: He is not in acute distress.    Appearance: Normal appearance. He is normal weight. He is not ill-appearing.  HENT:     Head: Normocephalic and atraumatic.  Musculoskeletal:        General: Tenderness present. No swelling, deformity or signs of injury.  Skin:    General: Skin is warm and dry.      Capillary Refill: Capillary refill takes less than 2 seconds.     Findings: Erythema present. No bruising or lesion.  Neurological:     General: No focal deficit present.     Mental Status: He is alert and oriented to person, place, and time.  Psychiatric:        Mood and Affect: Mood normal.        Behavior: Behavior normal.        Thought Content: Thought content normal.        Judgment: Judgment normal.      UC Treatments / Results  Labs (all labs ordered are listed, but only abnormal results are displayed) Labs Reviewed - No data to display  EKG   Radiology No results found.  Procedures Procedures (including critical care time)  Medications Ordered in UC Medications - No data to display  Initial Impression / Assessment and Plan / UC Course  I have reviewed the triage vital signs and the nursing notes.  Pertinent labs & imaging results that were available during my care of the patient were reviewed by me and considered in my medical decision making (see chart for details).   Patient is a very anxious 25 year old male here for evaluation of a cluster of unrelated symptoms to include dry skin on the medial aspect of the sole of his right foot, redness at the MTP joint of his right great toe, a cluster of bumps with a red base on the lateral aspect of his right ankle, and pain that extends from his right buttock down through the posterior lateral aspect of his right leg.  Patient works in [employer] and wears crocs so occasionally his feet will get wet when they wash the floor.  He also spends long hours standing on his feet.  Physical exam reveals dry skin without erythema or tenderness on the medial aspect of the right sole of his foot.  Patient has no pain or tenderness when palpating along the arch and the plantar fascia.  There is mild tenderness to the lateral aspect of the MTP joint of the right great toe and there is some mild blanchable erythema.   Suspect patient's crocs not fitting him right and his foot  is rubbing causing skin breakdown.  Patient does have a scab on the dorsum of his midfoot that has no surrounding erythema or drainage.  On the distal aspect of his lateral right ankle he has a cluster of vesicles with an erythematous base.  Under magnification these vesicles contain dark centers and are possibly clogged pores versus infected hair follicles.  Patient's right leg pain tracks along the path of the sciatic nerve.  Patient has full range of motion of his right hip, knee, ankle, and foot.  Patient does have a positive straight leg raise on the right.  Will treat patient for sciatica with a steroid pack, have him use mupirocin ointment on the cluster of vesicles that are possible folliculitis on the outside of his right ankle, wear better fitting shoes that have water resistance, and change his socks and shoes more frequently if they become wet.  Work note provided.   Final Clinical Impressions(s) / UC Diagnoses   Final diagnoses:  Sciatica of right side  Folliculitis  Pressure injury of toe of right foot, stage 1     Discharge Instructions     Take the prednisone pack as directed by the package starting this morning for your sciatica.  Apply the mupirocin ointment to the red area on the outside of your right ankle 3 times a day for 7 days.  Wear better fitting shoes and padded socks to avoid any further pressure injury to the joint of your big toe.  If your feet get wet you need to change your socks and dry your feet off to prevent skin breakdown and possible fungal infection.    ED Prescriptions    Medication Sig Dispense Auth. Provider   mupirocin nasal ointment (BACTROBAN) 2 % Apply to wound 3 times a day. 22 g Becky Augusta, NP   predniSONE (STERAPRED UNI-PAK 21 TAB) 10 MG (21) TBPK tablet Take 6 tablets on day 1, 5 tablets day 2, 4 tablets day 3, 3 tablets day 4, 2 tablets day 5, 1 tablet day 6 21 tablet Becky Augusta, NP     PDMP not reviewed this encounter.   Becky Augusta, NP 03/22/21 1019

## 2021-07-09 ENCOUNTER — Other Ambulatory Visit: Payer: Self-pay

## 2021-07-09 ENCOUNTER — Ambulatory Visit
Admission: EM | Admit: 2021-07-09 | Discharge: 2021-07-09 | Disposition: A | Discharge status: Home / Self Care | Payer: BC Managed Care – PPO | Attending: Physician Assistant | Admitting: Physician Assistant

## 2021-07-09 DIAGNOSIS — S80262A Insect bite (nonvenomous), left knee, initial encounter: Secondary | ICD-10-CM | POA: Diagnosis not present

## 2021-07-09 DIAGNOSIS — W57XXXA Bitten or stung by nonvenomous insect and other nonvenomous arthropods, initial encounter: Secondary | ICD-10-CM

## 2021-07-09 DIAGNOSIS — S70362A Insect bite (nonvenomous), left thigh, initial encounter: Secondary | ICD-10-CM | POA: Diagnosis not present

## 2021-07-09 MED ORDER — MUPIROCIN 2 % EX OINT: 1.0000 "application " | TOPICAL_OINTMENT | Freq: Two times a day (BID) | CUTANEOUS | 0 refills | Status: AC

## 2021-07-09 NOTE — Discharge Instructions (Addendum)
-  Bactroban ointment: Apply to insect bites twice a day until better -Ibuprofen Tylenol as needed for any pain -Follow-up with his clinic or primary care should redness or pain worsen.

## 2021-07-09 NOTE — ED Triage Notes (Signed)
Pt here with C/O to insect bite from 3 night ago on upper left leg and 2 nights ago back on left leg. Wants to make sure it isn't infected.

## 2021-07-09 NOTE — ED Provider Notes (Signed)
[facility]    CSN: [id number] Arrival date & time: 07/09/21  1246      History   Chief Complaint No chief complaint on file.   HPI Harry Singleton is a 25 y.o. male.   Patient is a 25 year old male who presents with chief complaint of insect bite to the back of the left knee and the upper left thigh that occurred a few days ago.  Patient ports the one on the back of the left knee was draining some fluid yesterday but has no longer draining.  Patient denies any pain to either bite but does report some irritation and itchiness.  Patient has taken no over-the-counter medications for it.  Patient is allergic to sulfa drugs.   Past Medical History:  Diagnosis Date   Sickle cell trait [facility])     Patient Active Problem List   Diagnosis Date Noted   Sickle cell trait (HCC) 01/08/2018   G6PD deficiency 08/07/2017    Past Surgical History:  Procedure Laterality Date   KNEE SURGERY Left 08/2015    Home Medications    Prior to Admission medications   Medication Sig Start Date End Date Taking? Authorizing Provider  mupirocin ointment (BACTROBAN) 2 % Apply 1 application topically 2 (two) times daily. To insect bites 07/09/21  Yes Candis Schatz, PA-C    Family History Family History  Problem Relation Age of Onset   Stroke Other    Heart failure Other    Diabetes Other     Social History Social History   Tobacco Use   Smoking status: Never   Smokeless tobacco: Never  Vaping Use   Vaping Use: Never used  Substance Use Topics   Alcohol use: Yes    Comment: social   Drug use: Yes    Types: Marijuana    Comment: last use today     Allergies   Blueberry flavor and Sulfa antibiotics   Review of Systems Review of Systems as noted above in HPI.  Other systems reviewed and found to be negative   Physical Exam Triage Vital Signs ED Triage Vitals  Enc Vitals Group     BP 07/09/21 1310 117/75     Pulse Rate 07/09/21 1310 61     Resp 07/09/21  1310 18     Temp 07/09/21 1310 98.2 F (36.8 C)     Temp Source 07/09/21 1310 Oral     SpO2 07/09/21 1310 100 %     Weight 07/09/21 1308 175 lb (79.4 kg)     Height 07/09/21 1308 5\' 8"  (1.727 m)     Head Circumference --      Peak Flow --      Pain Score 07/09/21 1308 0     Pain Loc --      Pain Edu? --      Excl. in GC? --    No data found.  Updated Vital Signs BP 117/75 (BP Location: Left Arm)   Pulse 61   Temp 98.2 F (36.8 C) (Oral)   Resp 18   Ht 5\' 8"  (1.727 m)   Wt 175 lb (79.4 kg)   SpO2 100%   BMI 26.61 kg/m    Physical Exam Constitutional:      Appearance: Normal appearance. He is ill-appearing.  Cardiovascular:     Rate and Rhythm: Normal rate and regular rhythm.  Pulmonary:     Effort: Pulmonary effort is normal. No respiratory distress.  Musculoskeletal:  General: Normal range of motion.     Cervical back: Normal range of motion.  Skin:    General: Skin is warm and dry.       Neurological:     Mental Status: He is alert.    Upper L thigh   Back L knee      UC Treatments / Results  Labs (all labs ordered are listed, but only abnormal results are displayed) Labs Reviewed - No data to display  EKG   Radiology No results found.  Procedures Procedures (including critical care time)  Medications Ordered in UC Medications - No data to display  Initial Impression / Assessment and Plan / UC Course  I have reviewed the triage vital signs and the nursing notes.  Pertinent labs & imaging results that were available during my care of the patient were reviewed by me and considered in my medical decision making (see chart for details).    Patient with insect bites to left leg.  Bite behind left knee with minimal induration and redness no drainage.  No fever.  Upper thigh bite without any induration no real discoloration, flat to touch.   Patient reports drainage to the wound of the medical leg yesterday, but again none today.  We will  give the patient a prescription for Bactroban ointment to use until it clears.  Final Clinical Impressions(s) / UC Diagnoses   Final diagnoses:  Insect bite, unspecified site, initial encounter     Discharge Instructions      -Bactroban ointment: Apply to insect bites twice a day until better -Ibuprofen Tylenol as needed for any pain -Follow-up with his clinic or primary care should redness or pain worsen.     ED Prescriptions     Medication Sig Dispense Auth. Provider   mupirocin ointment (BACTROBAN) 2 % Apply 1 application topically 2 (two) times daily. To insect bites 22 g Candis Schatz, PA-C      PDMP not reviewed this encounter.   Candis Schatz, PA-C 07/09/21 1415

## 2023-08-07 ENCOUNTER — Ambulatory Visit
Admission: EM | Admit: 2023-08-07 | Discharge: 2023-08-07 | Disposition: A | Discharge status: Home / Self Care | Payer: Commercial Managed Care - PPO | Attending: Family Medicine | Admitting: Family Medicine

## 2023-08-07 DIAGNOSIS — L989 Disorder of the skin and subcutaneous tissue, unspecified: Secondary | ICD-10-CM

## 2023-08-07 MED ORDER — DOXYCYCLINE HYCLATE 100 MG PO CAPS: 100.0000 mg | ORAL_CAPSULE | Freq: Two times a day (BID) | ORAL | 0 refills | Status: AC

## 2023-08-07 NOTE — ED Provider Notes (Signed)
[facility]    CSN: [id number] Arrival date & time: 08/07/23  1600      History   Chief Complaint Chief Complaint  Patient presents with   Abscess    HPI Harry Singleton is a 27 y.o. male.   HPI  Harry Singleton presents for concern for abscess that started 4-5 months ago and recently started hurting.  Feels swollen.   Area hurts to sit down.  Denies any drainage.  States he was told to come to the urgent care to get it opened up.    Past Medical History:  Diagnosis Date   Sickle cell trait Harry Singleton)     Patient Active Problem List   Diagnosis Date Noted   Sickle cell trait (HCC) 01/08/2018   G6PD deficiency 08/07/2017    Past Surgical History:  Procedure Laterality Date   KNEE SURGERY Left 08/2015       Home Medications    Prior to Admission medications   Medication Sig Start Date End Date Taking? Authorizing Provider  doxycycline (VIBRAMYCIN) 100 MG capsule Take 1 capsule (100 mg total) by mouth 2 (two) times daily. 08/07/23  Yes Harry Plaskett, DO  mupirocin ointment (BACTROBAN) 2 % Apply 1 application topically 2 (two) times daily. To insect bites 07/09/21   Harry Schatz, PA-C    Family History Family History  Problem Relation Age of Onset   Stroke Other    Heart failure Other    Diabetes Other     Social History Social History   Tobacco Use   Smoking status: Never   Smokeless tobacco: Never  Vaping Use   Vaping status: Never Used  Substance Use Topics   Alcohol use: Yes    Comment: social   Drug use: Yes    Types: Marijuana    Comment: last use today     Allergies   Blueberry flavor and Sulfa antibiotics   Review of Systems Review of Systems :negative unless otherwise stated in HPI.      Physical Exam Triage Vital Signs ED Triage Vitals  Encounter Vitals Group     BP 08/07/23 1640 139/88     Systolic BP Percentile --      Diastolic BP Percentile --      Pulse Rate 08/07/23 1640 71     Resp 08/07/23 1640 16      Temp 08/07/23 1640 98.7 F (37.1 C)     Temp Source 08/07/23 1640 Oral     SpO2 08/07/23 1640 98 %     Weight 08/07/23 1639 165 lb (74.8 kg)     Height 08/07/23 1639 5\' 8"  (1.727 m)     Head Circumference --      Peak Flow --      Pain Score 08/07/23 1643 0     Pain Loc --      Pain Education --      Exclude from Growth Chart --    No data found.  Updated Vital Signs BP 139/88 (BP Location: Right Arm)   Pulse 71   Temp 98.7 F (37.1 C) (Oral)   Resp 16   Ht 5\' 8"  (1.727 m)   Wt 74.8 kg   SpO2 98%   BMI 25.09 kg/m   Visual Acuity Right Eye Distance:   Left Eye Distance:   Bilateral Distance:    Right Eye Near:   Left Eye Near:    Bilateral Near:     Physical Exam  GEN: alert, well appearing male,  in no acute distress  EYES: extra occular movements intact, no scleral injection RESP: no increased work of breathing MSK: no extremity edema, normal range of motion lower extremities SKIN: warm and dry; 4 x 5 cm area of hyperpigmentation with scaly hypertrophic lesion on top, no fluctuance, no drainage, mild erythema    UC Treatments / Results  Labs (all labs ordered are listed, but only abnormal results are displayed) Labs Reviewed - No data to display  EKG   Radiology No results found.  Procedures Incision and Drainage  Date/Time: 08/10/2023 1:09 AM  Performed by: Harry Cabal, DO Authorized by: Harry Cabal, DO   Consent:    Consent obtained:  Verbal   Consent given by:  Patient   Risks, benefits, and alternatives were discussed: yes   Universal protocol:    Patient identity confirmed:  Verbally with patient Location:    Type:  Cyst   Size:  3 cm   Location:  Lower extremity   Lower extremity location:  Leg   Leg location:  L upper leg Pre-procedure details:    Skin preparation:  Chlorhexidine with alcohol Anesthesia:    Anesthesia method:  Local infiltration   Local anesthetic:  Lidocaine 1% WITH epi Procedure type:    Complexity:   Simple Procedure details:    Incision types:  Single straight and stab incision   Techniques: probed.   Drainage:  Bloody   Drainage amount:  Scant   Packing materials:  None Post-procedure details:    Procedure completion:  Tolerated  (including critical care time)  Medications Ordered in UC Medications - No data to display  Initial Impression / Assessment and Plan / UC Course  I have reviewed the triage vital signs and the nursing notes.  Pertinent labs & imaging results that were available during my care of the patient were reviewed by me and considered in my medical decision making (see chart for details).     Patient is a 27 y.o. malewho presents for skin lesion.  Overall, patient is well-appearing and well-hydrated.  Vital signs stable.  Harry Singleton is afebrile.  Exam concerning for possible abscess versus epidermoid cyst versus sebaceous cyst.  Discussed at length utility of I&D in this setting.  Patient request trial I&D as he believes it to be an abscess and nothing more.  I&D performed with scant serosanguineous drainage.  Recommended he follow-up with dermatology for complete removal.  Given antibiotics as the skin was opened.  Reviewed expectations regarding course of current medical issues.  All questions asked were answered.  Outlined signs and symptoms indicating need for more acute intervention. Patient verbalized understanding. After Visit Summary given.   Final Clinical Impressions(s) / UC Diagnoses   Final diagnoses:  Skin lesion     Discharge Instructions      Stop by the pharmacy to pick up your antibiotics.  Take twice a day for 7 days.  Follow-up with a dermatologist because I am concerned that this lesion may be deeper and needs to be removed under control conditions.      ED Prescriptions     Medication Sig Dispense Auth. Provider   doxycycline (VIBRAMYCIN) 100 MG capsule Take 1 capsule (100 mg total) by mouth 2 (two) times daily. 14 capsule Harry Singleton,  Harry Devincent, DO      PDMP not reviewed this encounter.              Harry Cabal, DO 08/10/23 0111

## 2023-08-07 NOTE — ED Triage Notes (Addendum)
Pt c/o abscess under L glute x4-5 mon. States area red,tender & increasing in size. Denies any drainage or fevers.

## 2023-08-07 NOTE — Discharge Instructions (Addendum)
Stop by the pharmacy to pick up your antibiotics.  Take twice a day for 7 days.  Follow-up with a dermatologist because I am concerned that this lesion may be deeper and needs to be removed under control conditions.

## 2023-08-10 DIAGNOSIS — L989 Disorder of the skin and subcutaneous tissue, unspecified: Secondary | ICD-10-CM
